# Patient Record
Sex: Female | Born: 1960 | Race: White | Hispanic: No | Marital: Married | Smoking: Never smoker
Health system: Southern US, Community
[De-identification: ages and names within clinical notes are randomized; demographics above are authoritative.]

## PROBLEM LIST (undated history)

## (undated) DIAGNOSIS — R112 Nausea with vomiting, unspecified: Secondary | ICD-10-CM

## (undated) DIAGNOSIS — Z460 Encounter for fitting and adjustment of spectacles and contact lenses: Secondary | ICD-10-CM

## (undated) DIAGNOSIS — E039 Hypothyroidism, unspecified: Secondary | ICD-10-CM

## (undated) DIAGNOSIS — I10 Essential (primary) hypertension: Secondary | ICD-10-CM

## (undated) DIAGNOSIS — Z9889 Other specified postprocedural states: Secondary | ICD-10-CM

## (undated) DIAGNOSIS — E079 Disorder of thyroid, unspecified: Secondary | ICD-10-CM

## (undated) HISTORY — PX: TONSILLECTOMY: SUR1361

## (undated) HISTORY — PX: ABDOMINAL HYSTERECTOMY: SHX81

## (undated) HISTORY — PX: TUBAL LIGATION: SHX77

## (undated) HISTORY — PX: WRIST ARTHROSCOPY: SUR100

## (undated) HISTORY — PX: CHOLECYSTECTOMY: SHX55

---

## 1999-10-25 ENCOUNTER — Encounter: Admission: RE | Admit: 1999-10-25 | Discharge: 2000-01-23 | Payer: Self-pay | Admitting: Internal Medicine

## 2000-11-20 ENCOUNTER — Encounter: Admission: RE | Admit: 2000-11-20 | Discharge: 2000-11-20 | Payer: Self-pay | Admitting: Internal Medicine

## 2000-11-20 ENCOUNTER — Encounter: Payer: Self-pay | Admitting: Internal Medicine

## 2001-02-13 ENCOUNTER — Encounter: Admission: RE | Admit: 2001-02-13 | Discharge: 2001-02-13 | Payer: Self-pay | Admitting: Internal Medicine

## 2001-02-13 ENCOUNTER — Encounter: Payer: Self-pay | Admitting: Internal Medicine

## 2001-02-15 ENCOUNTER — Encounter: Payer: Self-pay | Admitting: Internal Medicine

## 2001-02-15 ENCOUNTER — Ambulatory Visit (HOSPITAL_COMMUNITY): Admission: RE | Admit: 2001-02-15 | Discharge: 2001-02-15 | Payer: Self-pay | Admitting: Internal Medicine

## 2001-10-31 ENCOUNTER — Ambulatory Visit (HOSPITAL_BASED_OUTPATIENT_CLINIC_OR_DEPARTMENT_OTHER): Admission: RE | Admit: 2001-10-31 | Discharge: 2001-10-31 | Payer: Self-pay | Admitting: Orthopedic Surgery

## 2003-12-18 ENCOUNTER — Encounter: Admission: RE | Admit: 2003-12-18 | Discharge: 2003-12-18 | Payer: Self-pay | Admitting: Internal Medicine

## 2006-12-18 ENCOUNTER — Other Ambulatory Visit: Admission: RE | Admit: 2006-12-18 | Discharge: 2006-12-18 | Payer: Self-pay | Admitting: Family Medicine

## 2007-01-01 ENCOUNTER — Encounter: Admission: RE | Admit: 2007-01-01 | Discharge: 2007-01-01 | Payer: Self-pay | Admitting: Family Medicine

## 2008-02-11 ENCOUNTER — Encounter: Admission: RE | Admit: 2008-02-11 | Discharge: 2008-02-11 | Payer: Self-pay | Admitting: Family Medicine

## 2008-12-02 ENCOUNTER — Encounter: Admission: RE | Admit: 2008-12-02 | Discharge: 2008-12-02 | Payer: Self-pay | Admitting: Gastroenterology

## 2009-01-08 ENCOUNTER — Ambulatory Visit (HOSPITAL_COMMUNITY): Admission: RE | Admit: 2009-01-08 | Discharge: 2009-01-08 | Payer: Self-pay | Admitting: Sports Medicine

## 2009-01-23 ENCOUNTER — Ambulatory Visit: Payer: Self-pay | Admitting: Sports Medicine

## 2009-01-23 DIAGNOSIS — IMO0002 Reserved for concepts with insufficient information to code with codable children: Secondary | ICD-10-CM | POA: Insufficient documentation

## 2009-01-23 DIAGNOSIS — R071 Chest pain on breathing: Secondary | ICD-10-CM | POA: Insufficient documentation

## 2009-01-23 HISTORY — DX: Chest pain on breathing: R07.1

## 2009-01-26 ENCOUNTER — Encounter: Payer: Self-pay | Admitting: Sports Medicine

## 2009-03-02 ENCOUNTER — Encounter: Admission: RE | Admit: 2009-03-02 | Discharge: 2009-03-02 | Payer: Self-pay | Admitting: Family Medicine

## 2009-09-19 ENCOUNTER — Emergency Department (HOSPITAL_COMMUNITY): Admission: EM | Admit: 2009-09-19 | Discharge: 2009-09-19 | Payer: Self-pay | Admitting: Ophthalmology

## 2010-03-10 ENCOUNTER — Encounter: Admission: RE | Admit: 2010-03-10 | Discharge: 2010-03-10 | Payer: Self-pay | Admitting: Family Medicine

## 2010-04-26 ENCOUNTER — Encounter: Admission: RE | Admit: 2010-04-26 | Discharge: 2010-04-26 | Payer: Self-pay | Admitting: Nurse Practitioner

## 2011-01-09 ENCOUNTER — Encounter: Payer: Self-pay | Admitting: Family Medicine

## 2011-02-21 ENCOUNTER — Other Ambulatory Visit: Payer: Self-pay | Admitting: Nurse Practitioner

## 2011-02-21 DIAGNOSIS — Z1231 Encounter for screening mammogram for malignant neoplasm of breast: Secondary | ICD-10-CM

## 2011-03-14 ENCOUNTER — Ambulatory Visit: Payer: Self-pay

## 2011-03-23 ENCOUNTER — Ambulatory Visit
Admission: RE | Admit: 2011-03-23 | Discharge: 2011-03-23 | Disposition: A | Discharge status: Home / Self Care | Payer: BC Managed Care – PPO | Source: Ambulatory Visit | Attending: Nurse Practitioner | Admitting: Nurse Practitioner

## 2011-03-23 DIAGNOSIS — Z1231 Encounter for screening mammogram for malignant neoplasm of breast: Secondary | ICD-10-CM

## 2011-03-24 LAB — COMPREHENSIVE METABOLIC PANEL
Alkaline Phosphatase: 59 U/L (ref 39–117)
CO2: 24 mEq/L (ref 19–32)
Chloride: 107 mEq/L (ref 96–112)
Creatinine, Ser: 0.74 mg/dL (ref 0.4–1.2)
GFR calc non Af Amer: >60 mL/min (ref >60)
Glucose, Bld: 115 mg/dL — ABNORMAL HIGH (ref 70–99)
Sodium: 136 mEq/L (ref 135–145)
Total Bilirubin: 0.6 mg/dL (ref 0.3–1.2)
Total Protein: 6.4 g/dL (ref 6.0–8.3)

## 2011-03-24 LAB — DIFFERENTIAL
Monocytes Absolute: 0.5 10*3/uL (ref 0.1–1.0)
Monocytes Relative: 9 % (ref 3–12)
Neutrophils Relative %: 52 % (ref 43–77)

## 2011-03-24 LAB — URINALYSIS, ROUTINE W REFLEX MICROSCOPIC
Glucose, UA: NEGATIVE mg/dL
Hgb urine dipstick: NEGATIVE
Protein, ur: NEGATIVE mg/dL
Specific Gravity, Urine: 1.013 (ref 1.005–1.030)
Urobilinogen, UA: 0.2 mg/dL (ref 0.0–1.0)

## 2011-03-24 LAB — CBC
MCHC: 34.6 g/dL (ref 30.0–36.0)
MCV: 86.1 fL (ref 78.0–100.0)
RBC: 4.55 MIL/uL (ref 3.87–5.11)
WBC: 5.5 10*3/uL (ref 4.0–10.5)

## 2011-05-06 NOTE — H&P (Signed)
[place]. [facility]  Patient:    [name], [name] Visit Number: [id number] MRN: [id number]          Service Type: DSU Location: [facility] Attending Physician:  Marlowe Shores Dictated by:   Artist Pais Mina Marble, M.D. Admit Date:  10/31/2001                           History and Physical  PREOPERATIVE DIAGNOSIS:  Left wrist internal derangement.  POSTOPERATIVE DIAGNOSIS:  Left wrist internal derangement.  OPERATION:  Left wrist arthroscopy with debridement of ______ interosseous ligament tear and ulnar-sided synovectomy.  SURGEON:  Artist Pais. Mina Marble, M.D.  ASSISTANT:  R.N.  ANESTHESIA:  General.  TOURNIQUET TIME:  35 minutes.  COMPLICATIONS:  None.  DRAINS:  None.  DESCRIPTION OF PROCEDURE:  The patient was taken to the operating room.  After the induction of adequate general anesthesia, the left upper extremity was prepped and draped in the usual sterile fashion.  An Esmarch was used to exsanguinate the limb and the tourniquet inflated to 250 mmHg.  At this point in time the left upper extremity was padded and placed in the wrist traction tower with 12 pounds of ______ traction placed across the radiocarpal joint. A 25-gauge needle was used to establish a 3-port portal, and 5 cc of normal saline was instilled into the joint.  A small incision was made over this needle tract.  Blunt dissection was carried down to the capsule, and a blunt trocar was used to pierce the capsule and into the joint.  The scope was then inserted into the joint.  Visualization revealed intact radial side ligaments, intact scapholunate osseous ligament, a torn meniscal cruciate ligament, and an intact ______ .  An 18-gauge needle was used to establish a ______ outflow portal, followed by establishment under direct vision of a ______ working portal.  Once the ______ working portal was established, the suction jigger was placed into the ulnar side of the joint, and an  ulnar-sided synovectomy was performed.  Visualization at this point in time revealed a partially torn lunotriquetral ligament off the lunate.  This was debrided using a combination of a suction shaver, 2.9 gator, and a 1.5 mm Arthrocare wand.  After this was done, the ulnar-sided synovectomy was completed. Visualization revealed no inherent instability between the lunate and triquetrum, and the instruments were removed.  An 18-gauge needle was left in place, and Marcaine was injected into this for postoperative pain control. The portals were closed with 4-0 nylon, and the patient was then placed in a sterile dressing of xeroform, 4 x 4s, placed in a volar splint.  The patient tolerated the procedure well, went to the recovery room in stable fashion. Dictated by:   Artist Pais Mina Marble, M.D. Attending Physician:  Marlowe Shores DD:  10/31/01 TD:  10/31/01 Job: [id number]

## 2012-01-18 ENCOUNTER — Other Ambulatory Visit: Payer: Self-pay | Admitting: Orthopedic Surgery

## 2012-01-18 DIAGNOSIS — M25521 Pain in right elbow: Secondary | ICD-10-CM

## 2012-01-18 DIAGNOSIS — IMO0002 Reserved for concepts with insufficient information to code with codable children: Secondary | ICD-10-CM

## 2012-01-20 ENCOUNTER — Ambulatory Visit
Admission: RE | Admit: 2012-01-20 | Discharge: 2012-01-20 | Disposition: A | Discharge status: Home / Self Care | Payer: BC Managed Care – PPO | Source: Ambulatory Visit | Attending: Orthopedic Surgery | Admitting: Orthopedic Surgery

## 2012-01-20 DIAGNOSIS — M25521 Pain in right elbow: Secondary | ICD-10-CM

## 2012-01-20 DIAGNOSIS — IMO0002 Reserved for concepts with insufficient information to code with codable children: Secondary | ICD-10-CM

## 2012-01-23 ENCOUNTER — Other Ambulatory Visit: Payer: BC Managed Care – PPO

## 2012-03-07 ENCOUNTER — Other Ambulatory Visit: Payer: Self-pay | Admitting: Family Medicine

## 2012-03-07 DIAGNOSIS — Z780 Asymptomatic menopausal state: Secondary | ICD-10-CM

## 2012-03-20 ENCOUNTER — Other Ambulatory Visit: Payer: Self-pay | Admitting: Family Medicine

## 2012-03-20 DIAGNOSIS — Z1231 Encounter for screening mammogram for malignant neoplasm of breast: Secondary | ICD-10-CM

## 2012-04-16 ENCOUNTER — Ambulatory Visit: Payer: BC Managed Care – PPO

## 2012-04-16 ENCOUNTER — Other Ambulatory Visit: Payer: BC Managed Care – PPO

## 2012-04-30 ENCOUNTER — Ambulatory Visit
Admission: RE | Admit: 2012-04-30 | Discharge: 2012-04-30 | Disposition: A | Discharge status: Home / Self Care | Payer: BC Managed Care – PPO | Source: Ambulatory Visit | Attending: Family Medicine | Admitting: Family Medicine

## 2012-04-30 DIAGNOSIS — Z780 Asymptomatic menopausal state: Secondary | ICD-10-CM

## 2012-04-30 DIAGNOSIS — Z1231 Encounter for screening mammogram for malignant neoplasm of breast: Secondary | ICD-10-CM

## 2013-03-04 ENCOUNTER — Other Ambulatory Visit: Payer: Self-pay | Admitting: Orthopedic Surgery

## 2013-03-04 ENCOUNTER — Encounter (HOSPITAL_BASED_OUTPATIENT_CLINIC_OR_DEPARTMENT_OTHER): Payer: Self-pay | Admitting: *Deleted

## 2013-03-04 NOTE — Progress Notes (Signed)
No labs needed

## 2013-03-07 ENCOUNTER — Encounter (HOSPITAL_BASED_OUTPATIENT_CLINIC_OR_DEPARTMENT_OTHER): Payer: Self-pay | Admitting: Anesthesiology

## 2013-03-07 ENCOUNTER — Encounter (HOSPITAL_BASED_OUTPATIENT_CLINIC_OR_DEPARTMENT_OTHER)
Admission: RE | Disposition: A | Discharge status: Home / Self Care | Payer: Self-pay | Source: Ambulatory Visit | Attending: Orthopedic Surgery

## 2013-03-07 ENCOUNTER — Ambulatory Visit (HOSPITAL_BASED_OUTPATIENT_CLINIC_OR_DEPARTMENT_OTHER): Payer: BC Managed Care – PPO | Admitting: Anesthesiology

## 2013-03-07 ENCOUNTER — Ambulatory Visit (HOSPITAL_BASED_OUTPATIENT_CLINIC_OR_DEPARTMENT_OTHER)
Admission: RE | Admit: 2013-03-07 | Discharge: 2013-03-07 | Disposition: A | Discharge status: Home / Self Care | Payer: BC Managed Care – PPO | Source: Ambulatory Visit | Attending: Orthopedic Surgery | Admitting: Orthopedic Surgery

## 2013-03-07 ENCOUNTER — Encounter (HOSPITAL_BASED_OUTPATIENT_CLINIC_OR_DEPARTMENT_OTHER): Payer: Self-pay

## 2013-03-07 DIAGNOSIS — L03019 Cellulitis of unspecified finger: Secondary | ICD-10-CM | POA: Insufficient documentation

## 2013-03-07 DIAGNOSIS — Z91041 Radiographic dye allergy status: Secondary | ICD-10-CM | POA: Insufficient documentation

## 2013-03-07 DIAGNOSIS — Z881 Allergy status to other antibiotic agents status: Secondary | ICD-10-CM | POA: Insufficient documentation

## 2013-03-07 DIAGNOSIS — E039 Hypothyroidism, unspecified: Secondary | ICD-10-CM | POA: Insufficient documentation

## 2013-03-07 DIAGNOSIS — Z79899 Other long term (current) drug therapy: Secondary | ICD-10-CM | POA: Insufficient documentation

## 2013-03-07 DIAGNOSIS — Z885 Allergy status to narcotic agent status: Secondary | ICD-10-CM | POA: Insufficient documentation

## 2013-03-07 HISTORY — PX: I&D EXTREMITY: SHX5045

## 2013-03-07 HISTORY — DX: Hypothyroidism, unspecified: E03.9

## 2013-03-07 HISTORY — DX: Encounter for fitting and adjustment of spectacles and contact lenses: Z46.0

## 2013-03-07 HISTORY — DX: Other specified postprocedural states: Z98.890

## 2013-03-07 HISTORY — DX: Disorder of thyroid, unspecified: E07.9

## 2013-03-07 HISTORY — DX: Other specified postprocedural states: R11.2

## 2013-03-07 SURGERY — IRRIGATION AND DEBRIDEMENT EXTREMITY
Anesthesia: Regional | Site: Finger | Laterality: Right | Wound class: Dirty or Infected

## 2013-03-07 MED ORDER — CHLORHEXIDINE GLUCONATE 4 % EX LIQD: 60.0000 mL | Freq: Once | CUTANEOUS | Status: DC

## 2013-03-07 MED ORDER — IBUPROFEN 600 MG PO TABS
600.0000 mg | ORAL_TABLET | Freq: Once | ORAL | Status: AC | PRN
  Administered 2013-03-07: 600 mg via ORAL

## 2013-03-07 MED ORDER — CEFAZOLIN SODIUM-DEXTROSE 2-3 GM-% IV SOLR: 2.0000 g | INTRAVENOUS | Status: DC

## 2013-03-07 MED ORDER — LIDOCAINE HCL (PF) 0.5 % IJ SOLN
INTRAMUSCULAR | Status: DC | PRN
  Administered 2013-03-07: 30 mL via INTRAVENOUS

## 2013-03-07 MED ORDER — FENTANYL CITRATE 0.05 MG/ML IJ SOLN: 50.0000 ug | INTRAMUSCULAR | Status: DC | PRN

## 2013-03-07 MED ORDER — DIPHENHYDRAMINE HCL 50 MG/ML IJ SOLN
12.5000 mg | Freq: Once | INTRAMUSCULAR | Status: AC
  Administered 2013-03-07: 12.5 mg via INTRAVENOUS

## 2013-03-07 MED ORDER — FENTANYL CITRATE 0.05 MG/ML IJ SOLN: 25.0000 ug | INTRAMUSCULAR | Status: DC | PRN

## 2013-03-07 MED ORDER — BUTALBITAL-APAP-CAFFEINE 50-325-40 MG PO TABS: 1.0000 | ORAL_TABLET | Freq: Four times a day (QID) | ORAL | Status: AC | PRN

## 2013-03-07 MED ORDER — MIDAZOLAM HCL 5 MG/5ML IJ SOLN
INTRAMUSCULAR | Status: DC | PRN
  Administered 2013-03-07: 2 mg via INTRAVENOUS

## 2013-03-07 MED ORDER — MIDAZOLAM HCL 2 MG/2ML IJ SOLN: 1.0000 mg | INTRAMUSCULAR | Status: DC | PRN

## 2013-03-07 MED ORDER — PROPOFOL 10 MG/ML IV EMUL
INTRAVENOUS | Status: DC | PRN
  Administered 2013-03-07: 50 ug/kg/min via INTRAVENOUS

## 2013-03-07 MED ORDER — VANCOMYCIN HCL 1000 MG IV SOLR
1000.0000 mg | INTRAVENOUS | Status: DC | PRN
  Administered 2013-03-07: 1000 mg via INTRAVENOUS

## 2013-03-07 MED ORDER — LACTATED RINGERS IV SOLN
INTRAVENOUS | Status: DC
  Administered 2013-03-07 (×2): via INTRAVENOUS

## 2013-03-07 MED ORDER — BUPIVACAINE HCL (PF) 0.25 % IJ SOLN
INTRAMUSCULAR | Status: DC | PRN
  Administered 2013-03-07: 10 mL

## 2013-03-07 MED ORDER — ONDANSETRON HCL 4 MG/2ML IJ SOLN
INTRAMUSCULAR | Status: DC | PRN
  Administered 2013-03-07: 4 mg via INTRAVENOUS

## 2013-03-07 MED ORDER — DOXYCYCLINE HYCLATE 50 MG PO CAPS: 100.0000 mg | ORAL_CAPSULE | Freq: Two times a day (BID) | ORAL | Status: DC

## 2013-03-07 MED ORDER — FENTANYL CITRATE 0.05 MG/ML IJ SOLN
INTRAMUSCULAR | Status: DC | PRN
  Administered 2013-03-07: 50 ug via INTRAVENOUS

## 2013-03-07 MED ORDER — ONDANSETRON HCL 4 MG/2ML IJ SOLN: 4.0000 mg | Freq: Four times a day (QID) | INTRAMUSCULAR | Status: DC | PRN

## 2013-03-07 SURGICAL SUPPLY — 50 items
BAG DECANTER FOR FLEXI CONT (MISCELLANEOUS) IMPLANT
BANDAGE ELASTIC 3 VELCRO ST LF (GAUZE/BANDAGES/DRESSINGS) IMPLANT
BANDAGE GAUZE ELAST BULKY 4 IN (GAUZE/BANDAGES/DRESSINGS) IMPLANT
BANDAGE GAUZE STRT 1 STR LF (GAUZE/BANDAGES/DRESSINGS) ×2 IMPLANT
BLADE MINI RND TIP GREEN BEAV (BLADE) IMPLANT
BLADE SURG 15 STRL LF DISP TIS (BLADE) ×2 IMPLANT
BLADE SURG 15 STRL SS (BLADE) ×2
BNDG COHESIVE 1X5 TAN STRL LF (GAUZE/BANDAGES/DRESSINGS) ×2 IMPLANT
BNDG ELASTIC 2 VLCR STRL LF (GAUZE/BANDAGES/DRESSINGS) IMPLANT
BNDG ESMARK 4X9 LF (GAUZE/BANDAGES/DRESSINGS) IMPLANT
CHLORAPREP W/TINT 26ML (MISCELLANEOUS) ×2 IMPLANT
CLOTH BEACON ORANGE TIMEOUT ST (SAFETY) ×2 IMPLANT
CORDS BIPOLAR (ELECTRODE) ×2 IMPLANT
COVER MAYO STAND STRL (DRAPES) ×2 IMPLANT
COVER TABLE BACK 60X90 (DRAPES) ×2 IMPLANT
CUFF TOURNIQUET SINGLE 18IN (TOURNIQUET CUFF) ×2 IMPLANT
DRAPE EXTREMITY T 121X128X90 (DRAPE) ×2 IMPLANT
DRAPE SURG 17X23 STRL (DRAPES) ×2 IMPLANT
GAUZE PACKING IODOFORM 1/4X5 (PACKING) ×2 IMPLANT
GAUZE XEROFORM 1X8 LF (GAUZE/BANDAGES/DRESSINGS) ×2 IMPLANT
GLOVE BIO SURGEON STRL SZ7.5 (GLOVE) ×2 IMPLANT
GLOVE BIOGEL PI IND STRL 6.5 (GLOVE) ×1 IMPLANT
GLOVE BIOGEL PI IND STRL 8 (GLOVE) ×1 IMPLANT
GLOVE BIOGEL PI INDICATOR 6.5 (GLOVE) ×1
GLOVE BIOGEL PI INDICATOR 8 (GLOVE) ×1
GLOVE ECLIPSE 6.5 STRL STRAW (GLOVE) ×2 IMPLANT
GOWN BRE IMP PREV XXLGXLNG (GOWN DISPOSABLE) ×2 IMPLANT
GOWN PREVENTION PLUS XLARGE (GOWN DISPOSABLE) ×2 IMPLANT
LOOP VESSEL MAXI BLUE (MISCELLANEOUS) IMPLANT
NEEDLE HYPO 25X1 1.5 SAFETY (NEEDLE) ×2 IMPLANT
NS IRRIG 1000ML POUR BTL (IV SOLUTION) ×2 IMPLANT
PACK BASIN DAY SURGERY FS (CUSTOM PROCEDURE TRAY) ×2 IMPLANT
PAD CAST 3X4 CTTN HI CHSV (CAST SUPPLIES) IMPLANT
PADDING CAST ABS 4INX4YD NS (CAST SUPPLIES) ×1
PADDING CAST ABS COTTON 4X4 ST (CAST SUPPLIES) ×1 IMPLANT
PADDING CAST COTTON 3X4 STRL (CAST SUPPLIES)
SPLINT FNGR PLAIN END 5/8X3.25 (CAST SUPPLIES) ×1 IMPLANT
SPLINT PLASTALUME 3 1/4 (CAST SUPPLIES) ×2
SPLINT PLASTER CAST XFAST 3X15 (CAST SUPPLIES) IMPLANT
SPLINT PLASTER XTRA FASTSET 3X (CAST SUPPLIES)
SPONGE GAUZE 4X4 12PLY (GAUZE/BANDAGES/DRESSINGS) ×2 IMPLANT
STOCKINETTE 4X48 STRL (DRAPES) ×2 IMPLANT
SUT ETHILON 4 0 PS 2 18 (SUTURE) IMPLANT
SWAB COLLECTION DEVICE MRSA (MISCELLANEOUS) ×2 IMPLANT
SYR BULB 3OZ (MISCELLANEOUS) ×2 IMPLANT
SYR CONTROL 10ML LL (SYRINGE) ×2 IMPLANT
TOWEL OR 17X24 6PK STRL BLUE (TOWEL DISPOSABLE) ×2 IMPLANT
TUBE ANAEROBIC SPECIMEN COL (MISCELLANEOUS) ×2 IMPLANT
TUBE FEEDING 5FR 15 INCH (TUBING) IMPLANT
UNDERPAD 30X30 INCONTINENT (UNDERPADS AND DIAPERS) ×2 IMPLANT

## 2013-03-07 NOTE — Transfer of Care (Signed)
Immediate Anesthesia Transfer of Care Note  Patient: [name]  Procedure(s) Performed: Procedure(s): IRRIGATION AND DEBRIDEMENT RIGHT LONG FINGER (Right)  Patient Location: PACU  Anesthesia Type:Bier block  Level of Consciousness: sedated  Airway & Oxygen Therapy: Patient Spontanous Breathing and Patient connected to face mask oxygen  Post-op Assessment: Report given to PACU RN and Post -op Vital signs reviewed and stable  Post vital signs: Reviewed and stable  Complications: No apparent anesthesia complications

## 2013-03-07 NOTE — Op Note (Signed)
[id number] 

## 2013-03-07 NOTE — Anesthesia Preprocedure Evaluation (Signed)
Anesthesia Evaluation  Patient identified by MRN, date of birth, ID band Patient awake    Reviewed: Allergy & Precautions, H&P , NPO status , Patient's Chart, lab work & pertinent test results  History of Anesthesia Complications (+) PONV  Airway Mallampati: II  Neck ROM: full    Dental   Pulmonary          Cardiovascular     Neuro/Psych    GI/Hepatic   Endo/Other  Hypothyroidism   Renal/GU      Musculoskeletal   Abdominal   Peds  Hematology   Anesthesia Other Findings   Reproductive/Obstetrics                           Anesthesia Physical Anesthesia Plan  ASA: II  Anesthesia Plan: General   Post-op Pain Management:    Induction: Intravenous  Airway Management Planned: LMA  Additional Equipment:   Intra-op Plan:   Post-operative Plan:   Informed Consent: I have reviewed the patients History and Physical, chart, labs and discussed the procedure including the risks, benefits and alternatives for the proposed anesthesia with the patient or authorized representative who has indicated his/her understanding and acceptance.     Plan Discussed with: CRNA and Surgeon  Anesthesia Plan Comments:         Anesthesia Quick Evaluation

## 2013-03-07 NOTE — Anesthesia Postprocedure Evaluation (Signed)
Anesthesia Post Note  Patient: [name]  Procedure(s) Performed: Procedure(s) (LRB): IRRIGATION AND DEBRIDEMENT RIGHT LONG FINGER (Right)  Anesthesia type: MAC  Patient location: PACU  Post pain: Pain level controlled and Adequate analgesia  Post assessment: Post-op Vital signs reviewed, Patient's Cardiovascular Status Stable and Respiratory Function Stable  Last Vitals:  Filed Vitals:   03/07/13 1345  BP: 137/88  Pulse: 65  Temp:   Resp: 13    Post vital signs: Reviewed and stable  Level of consciousness: awake, alert  and oriented  Complications: No apparent anesthesia complications

## 2013-03-07 NOTE — Brief Op Note (Signed)
03/07/2013  12:25 PM  PATIENT:  Otilio Miu  52 y.o. female  PRE-OPERATIVE DIAGNOSIS:  RIGHT LONG PISONYCHIA  POST-OPERATIVE DIAGNOSIS:  RIGHT LONG PISONYCHIA  PROCEDURE:  Procedure(s): IRRIGATION AND DEBRIDEMENT RIGHT LONG FINGER (Right)  SURGEON:  Surgeon(s) and Role:    * Tami Ribas, MD - Primary  PHYSICIAN ASSISTANT:   ASSISTANTS: none   ANESTHESIA:   Bier block  EBL:  Total I/O In: 1000 [I.V.:1000] Out: -   BLOOD ADMINISTERED:none  DRAINS: iodoform packing  LOCAL MEDICATIONS USED:  MARCAINE     SPECIMEN:  Source of Specimen:  right long finger  DISPOSITION OF SPECIMEN:  micro  COUNTS:  YES  TOURNIQUET:   Total Tourniquet Time Documented: Forearm (Right) - 30 minutes Total: Forearm (Right) - 30 minutes   DICTATION: .Other Dictation: Dictation Number [id number]  PLAN OF CARE: Discharge to home after PACU  PATIENT DISPOSITION:  PACU - hemodynamically stable.

## 2013-03-07 NOTE — H&P (Signed)
  [name] is an 52 y.o. female.   Chief Complaint: right long finger paronychia HPI: 52 yo rhd female 5 weeks s/p I&D of right long finger paronychia.  Continues to have swelling/erythema/pain at site every time she stops antibiotics.  No fevers, chills, night sweats.  Plan repeat I&D of right long finger.  Past Medical History  Diagnosis Date  . Hypothyroidism   . Contact lens/glasses fitting     wears contacts or glasses  . Thyroid condition   . PONV (postoperative nausea and vomiting)     Past Surgical History  Procedure Laterality Date  . Wrist arthroscopy  2011x2    tendon repair -left  . Wrist arthroscopy      rt  . Cholecystectomy    . Cesarean section      x2  . Abdominal hysterectomy    . Tubal ligation    . Tonsillectomy      History reviewed. No pertinent family history. Social History:  reports that she has never smoked. She does not have any smokeless tobacco history on file. She reports that  drinks alcohol. She reports that she does not use illicit drugs.  Allergies:  Allergies  Allergen Reactions  . Ancef (Cefazolin) Swelling  . Hydrocodone Nausea And Vomiting  . Iohexol      Desc: PT HAD RECATION TO IV CONTRAST MEDIA IN THE 1990'S PT HAD FACIAL SWELLING, Onset Date: [id number]   . Percocet (Oxycodone-Acetaminophen) Nausea And Vomiting    Medications Prior to Admission  Medication Sig Dispense Refill  . liothyronine (CYTOMEL) 5 MCG tablet Take 5 mcg by mouth daily.      . naltrexone (DEPADE) 50 MG tablet Take 50 mg by mouth daily. Takes 4.5      . progesterone (PROMETRIUM) 100 MG capsule Take 100 mg by mouth daily.      Marland Kitchen testosterone (ANDROGEL) 50 MG/5GM GEL Place 5 g onto the skin every other day.      . thyroid (ARMOUR) 60 MG tablet Take 60 mg by mouth 2 (two) times daily.        No results found for this or any previous visit (from the past 48 hour(s)).  No results found.   A comprehensive review of systems was negative except for:  Eyes: positive for contacts/glasses  Blood pressure 124/78, pulse 77, temperature 98.4 F (36.9 C), temperature source Oral, resp. rate 18, height 5\' 6"  (1.676 m), weight 56.473 kg (124 lb 8 oz), SpO2 99.00%.  General appearance: alert, cooperative and appears stated age Head: Normocephalic, without obvious abnormality, atraumatic Neck: supple, symmetrical, trachea midline Resp: clear to auscultation bilaterally Cardio: regular rate and rhythm GI: non tender Extremities: intact sensation and capillary refill all digits.  right long with swelling and tenderness at radial side of nail.  no proximal streaks.  no tenderness in pad. Pulses: 2+ and symmetric Skin: swelling and redness at radial side of right long finger nail Neurologic: Grossly normal Incision/Wound: na  Assessment/Plan Right long finger recurrent paronychia.  Plan repeat I&D of right long finger.  Risks, benefits, and alternatives of surgery were discussed and the patient agrees with the plan of care.   Lani Mendiola R 03/07/2013, 11:10 AM

## 2013-03-08 ENCOUNTER — Encounter (HOSPITAL_BASED_OUTPATIENT_CLINIC_OR_DEPARTMENT_OTHER): Payer: Self-pay | Admitting: Orthopedic Surgery

## 2013-03-08 NOTE — Op Note (Signed)
NAMETresea, [name] NO.:  [id number]  MEDICAL RECORD NO.:  [id number]  LOCATION:                                 FACILITY:  PHYSICIAN:  Betha Loa, MD             DATE OF BIRTH:  DATE OF PROCEDURE:  03/07/2013 DATE OF DISCHARGE:                              OPERATIVE REPORT   PREOPERATIVE DIAGNOSIS:  Right long finger recurrent paronychia.  POSTOPERATIVE DIAGNOSIS:  Right long finger recurrent paronychia.  PROCEDURE:  Irrigation and debridement of right long finger paronychia.  SURGEON:  Betha Loa, MD  ASSISTANT:  None.  ANESTHESIA:  Bier block with sedation.  IV FLUIDS:  Per Anesthesia flow sheet.  ESTIMATED BLOOD LOSS:  Minimal.  COMPLICATIONS:  None.  SPECIMENS:  Cultures for aerobes, anaerobes, AFB, and fungus from right long finger to Microbiology.  TOURNIQUET TIME:  30 minutes.  DISPOSITION:  Stable to PACU.  INDICATIONS:  [name] is a 52 year old female who has had a paronychia of the right long finger for approximately 5 weeks.  This was irrigated and debrided in the office initially.  She continues to have swelling, erythema, and pain every time she goes off antibiotics.  She returned to the operating room today for repeat irrigation and debridement of the right long finger paronychia.  Risks, benefits, and alternatives of surgery were discussed including risk of blood loss, infection, damage to nerves, vessels, tendons, ligaments, bone, failure of surgery, need for additional surgery, complications with wound healing, continued pain, continued infection, need for repeat irrigation and debridement.  She voiced understanding of these risks and elected to proceed.  OPERATIVE COURSE:  After being identified preoperatively by myself, the patient and I agreed upon procedure and site of procedure.  Surgical site was marked.  The risks, benefits, and alternatives of surgery were reviewed, and she wished to proceed.  Surgical  consent had been signed. Antibiotics were held for cultures.  She was transferred to the operating room, placed on the operating room table in supine position with the right upper extremity on arm board.  Bier block anesthesia was induced by anesthesiologist.  The right upper extremity was prepped and draped in normal sterile orthopedic fashion.  Surgical pause was performed between surgeons, anesthesia, and operating staff, and all were in agreement as to the patient, procedure, and site of procedure. The tourniquet had been inflated for the Bier block.  The nail was removed from the long finger.  There was no gross purulence.  The radial side of nail fold where the swelling, erythema was, there was a piece of unusual looking tissue on the undersurface of the nail fold.  This was removed.  Incision was made.  There was no gross purulence.  I did see any foreign matter.  The soft tissues were spread.  Cultures for aerobes, anaerobes, AFB, and fungus were taken.  The wound was copiously irrigated with sterile saline.  The wound was packed with quarter-inch iodoform gauze.  Piece of Xeroform was placed in the nail fold.  The digital block was performed with 10 mL of 0.25% plain Marcaine to aid in  postoperative analgesia.  Wound was then dressed with sterile Xeroform, 4x4s, and wrapped with a Coban dressing lightly.  Alumafoam splint was placed for comfort and wrapped with the Coban as well.  The tourniquet was deflated at 30 minutes.  Preoperative drapes were broken down.  The patient was awoken from anesthesia safely.  She was transferred back to stretcher and taken to the PACU in stable condition.  I will see her back in the office in 4 days for postprocedure followup.  I will give her Fioricet for pain as she has significant nausea with hydrocodone and Ultram.  She has had her Fioricet in the past with no problems.     Betha Loa, MD     KK/MEDQ  D:  03/07/2013  T:  03/08/2013   Job:  [id number]

## 2013-03-09 LAB — WOUND CULTURE

## 2013-03-12 LAB — ANAEROBIC CULTURE: Gram Stain: NONE SEEN

## 2013-04-05 LAB — FUNGUS CULTURE W SMEAR

## 2013-04-19 LAB — AFB CULTURE WITH SMEAR (NOT AT ARMC): Acid Fast Smear: NONE SEEN

## 2013-07-11 ENCOUNTER — Other Ambulatory Visit: Payer: Self-pay

## 2013-07-11 DIAGNOSIS — Z1231 Encounter for screening mammogram for malignant neoplasm of breast: Secondary | ICD-10-CM

## 2013-07-29 ENCOUNTER — Ambulatory Visit
Admission: RE | Admit: 2013-07-29 | Discharge: 2013-07-29 | Disposition: A | Discharge status: Home / Self Care | Payer: BC Managed Care – PPO | Source: Ambulatory Visit

## 2013-07-29 DIAGNOSIS — Z1231 Encounter for screening mammogram for malignant neoplasm of breast: Secondary | ICD-10-CM

## 2014-05-31 ENCOUNTER — Encounter (HOSPITAL_COMMUNITY): Payer: Self-pay | Admitting: Emergency Medicine

## 2014-05-31 ENCOUNTER — Emergency Department (HOSPITAL_COMMUNITY)
Admission: EM | Admit: 2014-05-31 | Discharge: 2014-05-31 | Disposition: A | Discharge status: Home / Self Care | Payer: BC Managed Care – PPO | Attending: Emergency Medicine | Admitting: Emergency Medicine

## 2014-05-31 DIAGNOSIS — M6281 Muscle weakness (generalized): Secondary | ICD-10-CM | POA: Insufficient documentation

## 2014-05-31 DIAGNOSIS — IMO0002 Reserved for concepts with insufficient information to code with codable children: Secondary | ICD-10-CM | POA: Insufficient documentation

## 2014-05-31 DIAGNOSIS — E039 Hypothyroidism, unspecified: Secondary | ICD-10-CM | POA: Insufficient documentation

## 2014-05-31 DIAGNOSIS — R7989 Other specified abnormal findings of blood chemistry: Secondary | ICD-10-CM

## 2014-05-31 DIAGNOSIS — Z79899 Other long term (current) drug therapy: Secondary | ICD-10-CM | POA: Insufficient documentation

## 2014-05-31 DIAGNOSIS — R946 Abnormal results of thyroid function studies: Secondary | ICD-10-CM | POA: Insufficient documentation

## 2014-05-31 DIAGNOSIS — R0602 Shortness of breath: Secondary | ICD-10-CM | POA: Insufficient documentation

## 2014-05-31 DIAGNOSIS — E079 Disorder of thyroid, unspecified: Secondary | ICD-10-CM | POA: Insufficient documentation

## 2014-05-31 DIAGNOSIS — R42 Dizziness and giddiness: Secondary | ICD-10-CM | POA: Insufficient documentation

## 2014-05-31 DIAGNOSIS — R531 Weakness: Secondary | ICD-10-CM

## 2014-05-31 LAB — COMPREHENSIVE METABOLIC PANEL
ALBUMIN: 3.6 g/dL (ref 3.5–5.2)
ALK PHOS: 52 U/L (ref 39–117)
ALT: 11 U/L (ref 0–35)
AST: 14 U/L (ref 0–37)
BILIRUBIN TOTAL: 0.4 mg/dL (ref 0.3–1.2)
BUN: 16 mg/dL (ref 6–23)
CHLORIDE: 109 meq/L (ref 96–112)
CO2: 23 mEq/L (ref 19–32)
Calcium: 8.7 mg/dL (ref 8.4–10.5)
Creatinine, Ser: 0.66 mg/dL (ref 0.50–1.10)
GFR calc Af Amer: >90 mL/min (ref >90)
GFR calc non Af Amer: >90 mL/min (ref >90)
Glucose, Bld: 105 mg/dL — ABNORMAL HIGH (ref 70–99)
POTASSIUM: 4 meq/L (ref 3.7–5.3)
SODIUM: 144 meq/L (ref 137–147)
TOTAL PROTEIN: 6.7 g/dL (ref 6.0–8.3)

## 2014-05-31 LAB — CBC WITH DIFFERENTIAL/PLATELET
BASOS PCT: 0 % (ref 0–1)
Basophils Absolute: 0 10*3/uL (ref 0.0–0.1)
EOS ABS: 0.1 10*3/uL (ref 0.0–0.7)
Eosinophils Relative: 2 % (ref 0–5)
HCT: 42.5 % (ref 36.0–46.0)
HEMOGLOBIN: 14 g/dL (ref 12.0–15.0)
LYMPHS ABS: 1.3 10*3/uL (ref 0.7–4.0)
Lymphocytes Relative: 23 % (ref 12–46)
MCH: 28.5 pg (ref 26.0–34.0)
MCHC: 32.9 g/dL (ref 30.0–36.0)
MCV: 86.6 fL (ref 78.0–100.0)
Monocytes Absolute: 0.3 10*3/uL (ref 0.1–1.0)
Monocytes Relative: 6 % (ref 3–12)
NEUTROS ABS: 3.9 10*3/uL (ref 1.7–7.7)
NEUTROS PCT: 69 % (ref 43–77)
PLATELETS: 174 10*3/uL (ref 150–400)
RBC: 4.91 MIL/uL (ref 3.87–5.11)
RDW: 12.5 % (ref 11.5–15.5)
WBC: 5.6 10*3/uL (ref 4.0–10.5)

## 2014-05-31 LAB — URINALYSIS, ROUTINE W REFLEX MICROSCOPIC
BILIRUBIN URINE: NEGATIVE
GLUCOSE, UA: NEGATIVE mg/dL
HGB URINE DIPSTICK: NEGATIVE
Ketones, ur: NEGATIVE mg/dL
Leukocytes, UA: NEGATIVE
Nitrite: NEGATIVE
PH: 7 (ref 5.0–8.0)
Protein, ur: NEGATIVE mg/dL
SPECIFIC GRAVITY, URINE: 1.011 (ref 1.005–1.030)
Urobilinogen, UA: 0.2 mg/dL (ref 0.0–1.0)

## 2014-05-31 LAB — I-STAT TROPONIN, ED: Troponin i, poc: 0.01 ng/mL (ref 0.00–0.08)

## 2014-05-31 LAB — TSH: TSH: 0.007 u[IU]/mL — ABNORMAL LOW (ref 0.350–4.500)

## 2014-05-31 MED ORDER — DIAZEPAM 5 MG PO TABS
5.0000 mg | ORAL_TABLET | Freq: Once | ORAL | Status: AC
  Administered 2014-05-31: 5 mg via ORAL
  Filled 2014-05-31: qty 1

## 2014-05-31 MED ORDER — DIAZEPAM 5 MG PO TABS: 5.0000 mg | ORAL_TABLET | Freq: Two times a day (BID) | ORAL | Status: DC

## 2014-05-31 MED ORDER — SODIUM CHLORIDE 0.9 % IV BOLUS (SEPSIS)
1000.0000 mL | Freq: Once | INTRAVENOUS | Status: AC
  Administered 2014-05-31: 1000 mL via INTRAVENOUS

## 2014-05-31 NOTE — ED Provider Notes (Signed)
Medical screening examination/treatment/procedure(s) were performed by non-physician practitioner and as supervising physician I was immediately available for consultation/collaboration.   EKG Interpretation   Date/Time:  Saturday May 31 2014 09:04:35 EDT Ventricular Rate:  96 PR Interval:  122 QRS Duration: 82 QT Interval:  368 QTC Calculation: 465 R Axis:   53 Text Interpretation:  Sinus rhythm RSR' in V1 or V2, probably normal  variant ST elev, probable normal early repol pattern Confirmed by  Rubin PayorPICKERING  MD, Excell Neyland [phone]) on 05/31/2014 9:26:31 AM       Juliet RudeNathan R. Rubin PayorPickering, MD 05/31/14 [phone]

## 2014-05-31 NOTE — ED Provider Notes (Signed)
CSN: [id number]     Arrival date & time 05/31/14  0857 History   First MD Initiated Contact with Patient 05/31/14 [phone]     Chief Complaint  Patient presents with  . Dizziness  . Weakness     (Consider location/radiation/quality/duration/timing/severity/associated sxs/prior Treatment) HPI Comments: Patient with history of hyperthyroidism presents with complaint of weakness over her entire body as well as dizziness described as spinning sensation. Patient states that she awoke from sleep early this morning and felt dizzy. She ate to see if this would improve symptoms. She does not weak at this time. Upon awaking this morning, patient had worsening spinning sensation as well as weakness everywhere to the point were she cannot walk. She feels a "heavy weight on her entire body". She feels short of breath with any movement. She denies chest pain. No fever or URI symptoms. No neck pain. No numbness or tingling. No abdominal pain. No nausea, vomiting, diarrhea. No dark stools or bloody stools. No urinary symptoms of dysuria, hematuria. Patient is usually fairly active, rides horses, cleans houses. The onset of this condition was acute. The course is constant. Aggravating factors: none. Alleviating factors: none.    Patient is a 53 y.o. female presenting with dizziness and weakness. The history is provided by the patient and medical records.  Dizziness Associated symptoms: shortness of breath   Associated symptoms: no chest pain, no diarrhea, no headaches, no nausea and no vomiting   Weakness Associated symptoms include weakness. Pertinent negatives include no abdominal pain, chest pain, congestion, coughing, fever, headaches, myalgias, nausea, numbness, rash, sore throat or vomiting.    Past Medical History  Diagnosis Date  . Hypothyroidism   . Contact lens/glasses fitting     wears contacts or glasses  . Thyroid condition   . PONV (postoperative nausea and vomiting)    Past Surgical History   Procedure Laterality Date  . Wrist arthroscopy  2011x2    tendon repair -left  . Wrist arthroscopy      rt  . Cholecystectomy    . Cesarean section      x2  . Abdominal hysterectomy    . Tubal ligation    . Tonsillectomy    . I&d extremity Right 03/07/2013    Procedure: IRRIGATION AND DEBRIDEMENT RIGHT LONG FINGER;  Surgeon: Tami Ribas, MD;  Location: [facility];  Service: Orthopedics;  Laterality: Right;   History reviewed. No pertinent family history. History  Substance Use Topics  . Smoking status: Never Smoker   . Smokeless tobacco: Never Used  . Alcohol Use: Yes     Comment: "a glass of wine every now and then"   OB History   Grav Para Term Preterm Abortions TAB SAB Ect Mult Living                 Review of Systems  Constitutional: Negative for fever.  HENT: Negative for congestion, rhinorrhea and sore throat.   Eyes: Negative for redness.  Respiratory: Positive for shortness of breath. Negative for cough and wheezing.   Cardiovascular: Negative for chest pain.  Gastrointestinal: Negative for nausea, vomiting, abdominal pain and diarrhea.  Genitourinary: Negative for dysuria.  Musculoskeletal: Negative for myalgias.  Skin: Negative for rash.  Neurological: Positive for dizziness and weakness. Negative for syncope, facial asymmetry, speech difficulty, light-headedness, numbness and headaches.   Allergies  Ancef; Hydrocodone; Iohexol; and Percocet  Home Medications   Prior to Admission medications   Medication Sig Start Date End Date Taking? Authorizing  Provider  ARMOUR THYROID 60 MG tablet Take 120 mg by mouth daily. 05/05/14  Yes Historical Provider, MD  Cholecalciferol (VITAMIN D PO) Take 1 tablet by mouth 2 (two) times a week.   Yes Historical Provider, MD  Coenzyme Q10 (CO Q 10 PO) Take 1 capsule by mouth 2 (two) times a week.   Yes Historical Provider, MD  Cyanocobalamin (B-12 PO) Take 1 tablet by mouth 2 (two) times a week.   Yes Historical  Provider, MD  Estradiol-Estriol-Progesterone (BIEST/PROGESTERONE) CREA Place 1 application onto the skin daily.   Yes Historical Provider, MD  liothyronine (CYTOMEL) 25 MCG tablet Take 25 mcg by mouth daily.   Yes Historical Provider, MD  Misc Natural Products (NF FORMULAS TESTOSTERONE PO) Apply 1 application topically every other day.   Yes Historical Provider, MD  naltrexone (DEPADE) 50 MG tablet Take 50 mg by mouth at bedtime.   Yes Historical Provider, MD  Nutritional Supplements (DHEA PO) Take 5 mg by mouth 2 (two) times a week.   Yes Historical Provider, MD  Progesterone Micronized (PROGESTERONE PO) Take 150 mg by mouth daily.   Yes Historical Provider, MD  Red Yeast Rice Extract (RED YEAST RICE PO) Take 1 capsule by mouth daily.   Yes Historical Provider, MD  SYNTHROID 50 MCG tablet Take 50 mcg by mouth daily. 04/28/14  Yes Historical Provider, MD  diazepam (VALIUM) 5 MG tablet Take 1 tablet (5 mg total) by mouth 2 (two) times daily. 05/31/14   Renne CriglerJoshua Rajanee Schuelke, PA-C   BP 163/77  Pulse 69  Temp(Src) 97.7 F (36.5 C) (Oral)  Resp 17  SpO2 98%  Physical Exam  Nursing note and vitals reviewed. Constitutional: She is oriented to person, place, and time. She appears well-developed and well-nourished.  HENT:  Head: Normocephalic and atraumatic.  Right Ear: Tympanic membrane, external ear and ear canal normal.  Left Ear: Tympanic membrane, external ear and ear canal normal.  Nose: Nose normal.  Mouth/Throat: Uvula is midline, oropharynx is clear and moist and mucous membranes are normal.  Eyes: Conjunctivae, EOM and lids are normal. Pupils are equal, round, and reactive to light. Right eye exhibits no nystagmus. Left eye exhibits no nystagmus.  Neck: Normal range of motion. Neck supple.  Cardiovascular: Normal rate and regular rhythm.   Pulmonary/Chest: Effort normal and breath sounds normal.  Abdominal: Soft. There is no tenderness.  Musculoskeletal:       Cervical back: She exhibits  normal range of motion, no tenderness and no bony tenderness.  Patient with inconsistent exam. She can barely lift her arms and legs against gravity and gives inconsistent effort with flexor/extensor testing of extremities.   Neurological: She is alert and oriented to person, place, and time. She has normal strength and normal reflexes. No cranial nerve deficit or sensory deficit. She displays a negative Romberg sign. Coordination and gait normal. GCS eye subscore is 4. GCS verbal subscore is 5. GCS motor subscore is 6.  Skin: Skin is warm and dry.  Psychiatric: She has a normal mood and affect.    ED Course  Procedures (including critical care time) Labs Review Labs Reviewed  COMPREHENSIVE METABOLIC PANEL - Abnormal; Notable for the following:    Glucose, Bld 105 (*)    All other components within normal limits  TSH - Abnormal; Notable for the following:    TSH 0.007 (*)    All other components within normal limits  CBC WITH DIFFERENTIAL  URINALYSIS, ROUTINE W REFLEX MICROSCOPIC  I-STAT TROPOININ, ED  Imaging Review No results found.   EKG Interpretation   Date/Time:  Saturday May 31 2014 09:04:35 EDT Ventricular Rate:  96 PR Interval:  122 QRS Duration: 82 QT Interval:  368 QTC Calculation: 465 R Axis:   53 Text Interpretation:  Sinus rhythm RSR' in V1 or V2, probably normal  variant ST elev, probable normal early repol pattern Confirmed by  Rubin PayorPICKERING  MD, NATHAN [phone]([id number]) on 05/31/2014 9:26:31 AM      1:05 PM Patient informed of labs. She is feeling better. She has walked to the bathroom with minimal assistance. Vertigo is resolved with valium.   Patient will f/u with PCP to address low TSH and adjust thyroid hormone.   Patient encouraged to eat and hydrate well for the next several days. She is to return with worsening symptoms. Patient counseled to return if they have weakness in their arms or legs, slurred speech, trouble walking or talking, confusion, trouble with  their balance, or if they have any other concerns. Patient verbalizes understanding and agrees with plan.   MDM   Final diagnoses:  Vertigo  Generalized weakness  Low TSH level   Patient with generalized weakness and dizziness most consistent with vertigo. Symptoms improved in emergency department with fluids and oral Valium. Patient is feeling better. She has ambulated without difficulty. No indications for admission. Return instructions discussed with patient and family at bedside. PCP followup as above.    Renne CriglerJoshua Kruze Atchley, PA-C 05/31/14 1310

## 2014-05-31 NOTE — ED Notes (Signed)
Pt ambulated to bathroom with 1 staff assist

## 2014-05-31 NOTE — ED Notes (Signed)
[facility] with c/o dizziness, nauseous and then an "elephant sitting on her whole body" when she woke up at 2 am.  Pt reported left arm pain during transport but has since relieved on its own.  Pt states she has recently had thyroid medications changed.  Pt in NAD, A&O.

## 2014-05-31 NOTE — Discharge Instructions (Signed)
Please read and follow all provided instructions.  Your diagnoses today include:  1. Vertigo   2. Generalized weakness   3. Low TSH level     Tests performed today include:  Blood counts and electrolytes  Urine test  Test for heart muscle damage and EKG  TSH - low. Value was 0.007  Vital signs. See below for your results today.   Medications prescribed:   Valium - medication for vertigo  DO NOT drive or perform any activities that require you to be awake and alert because this medicine can make you drowsy.   Take any prescribed medications only as directed.  Home care instructions:  Follow any educational materials contained in this packet.  Follow-up instructions: Please follow-up with your primary care provider in the next 3 days for further evaluation of your symptoms. If you do not have a primary care doctor -- see below for referral information.   Return instructions:    Please return to the Emergency Department if you experience worsening symptoms.  Return if you have weakness in your arms or legs, slurred speech, trouble walking or talking, confusion, or trouble with your balance.   Please return if you have any other emergent concerns.  Additional Information:  Your vital signs today were: BP 127/67   Pulse 69   Temp(Src) 97.7 F (36.5 C) (Oral)   Resp 14   SpO2 97% If your blood pressure (BP) was elevated above 135/85 this visit, please have this repeated by your doctor within one month. --------------

## 2014-08-14 ENCOUNTER — Other Ambulatory Visit: Payer: Self-pay

## 2014-08-14 DIAGNOSIS — Z1231 Encounter for screening mammogram for malignant neoplasm of breast: Secondary | ICD-10-CM

## 2014-08-26 ENCOUNTER — Ambulatory Visit
Admission: RE | Admit: 2014-08-26 | Discharge: 2014-08-26 | Disposition: A | Discharge status: Home / Self Care | Payer: BC Managed Care – PPO | Source: Ambulatory Visit

## 2014-08-26 DIAGNOSIS — Z1231 Encounter for screening mammogram for malignant neoplasm of breast: Secondary | ICD-10-CM

## 2015-09-21 ENCOUNTER — Other Ambulatory Visit: Payer: Self-pay

## 2015-09-21 DIAGNOSIS — Z1231 Encounter for screening mammogram for malignant neoplasm of breast: Secondary | ICD-10-CM

## 2015-10-15 ENCOUNTER — Ambulatory Visit
Admission: RE | Admit: 2015-10-15 | Discharge: 2015-10-15 | Disposition: A | Discharge status: Home / Self Care | Payer: 59 | Source: Ambulatory Visit

## 2015-10-15 DIAGNOSIS — Z1231 Encounter for screening mammogram for malignant neoplasm of breast: Secondary | ICD-10-CM

## 2016-02-01 ENCOUNTER — Other Ambulatory Visit: Payer: Self-pay | Admitting: Neurosurgery

## 2016-02-01 DIAGNOSIS — M5414 Radiculopathy, thoracic region: Secondary | ICD-10-CM

## 2016-02-05 ENCOUNTER — Other Ambulatory Visit: Payer: 59

## 2016-09-28 ENCOUNTER — Other Ambulatory Visit: Payer: Self-pay | Admitting: Family Medicine

## 2016-09-28 DIAGNOSIS — Z1231 Encounter for screening mammogram for malignant neoplasm of breast: Secondary | ICD-10-CM

## 2016-10-27 ENCOUNTER — Ambulatory Visit
Admission: RE | Admit: 2016-10-27 | Discharge: 2016-10-27 | Disposition: A | Discharge status: Home / Self Care | Payer: 59 | Source: Ambulatory Visit | Attending: Family Medicine | Admitting: Family Medicine

## 2016-10-27 DIAGNOSIS — Z1231 Encounter for screening mammogram for malignant neoplasm of breast: Secondary | ICD-10-CM

## 2017-10-20 ENCOUNTER — Other Ambulatory Visit: Payer: Self-pay | Admitting: Family Medicine

## 2017-10-20 DIAGNOSIS — E2839 Other primary ovarian failure: Secondary | ICD-10-CM

## 2017-10-23 ENCOUNTER — Other Ambulatory Visit: Payer: Self-pay | Admitting: Family Medicine

## 2017-10-23 DIAGNOSIS — Z1231 Encounter for screening mammogram for malignant neoplasm of breast: Secondary | ICD-10-CM

## 2017-11-22 ENCOUNTER — Ambulatory Visit
Admission: RE | Admit: 2017-11-22 | Discharge: 2017-11-22 | Disposition: A | Discharge status: Home / Self Care | Payer: 59 | Source: Ambulatory Visit | Attending: Family Medicine | Admitting: Family Medicine

## 2017-11-22 DIAGNOSIS — Z1231 Encounter for screening mammogram for malignant neoplasm of breast: Secondary | ICD-10-CM

## 2017-11-22 DIAGNOSIS — E2839 Other primary ovarian failure: Secondary | ICD-10-CM

## 2018-11-19 ENCOUNTER — Other Ambulatory Visit: Payer: Self-pay | Admitting: Family Medicine

## 2018-11-19 DIAGNOSIS — Z1231 Encounter for screening mammogram for malignant neoplasm of breast: Secondary | ICD-10-CM

## 2019-01-15 ENCOUNTER — Other Ambulatory Visit (HOSPITAL_COMMUNITY): Payer: Self-pay | Admitting: *Deleted

## 2019-01-15 DIAGNOSIS — Z1231 Encounter for screening mammogram for malignant neoplasm of breast: Secondary | ICD-10-CM

## 2019-04-18 ENCOUNTER — Ambulatory Visit (HOSPITAL_COMMUNITY): Payer: 59

## 2019-08-06 ENCOUNTER — Ambulatory Visit
Admission: RE | Admit: 2019-08-06 | Discharge: 2019-08-06 | Disposition: A | Discharge status: Home / Self Care | Payer: No Typology Code available for payment source | Source: Ambulatory Visit | Attending: Obstetrics and Gynecology | Admitting: Obstetrics and Gynecology

## 2019-08-06 ENCOUNTER — Encounter (HOSPITAL_COMMUNITY): Payer: Self-pay

## 2019-08-06 ENCOUNTER — Other Ambulatory Visit: Payer: Self-pay

## 2019-08-06 ENCOUNTER — Ambulatory Visit (HOSPITAL_COMMUNITY)
Admission: RE | Admit: 2019-08-06 | Discharge: 2019-08-06 | Disposition: A | Discharge status: Home / Self Care | Payer: Self-pay | Source: Ambulatory Visit | Attending: Obstetrics and Gynecology | Admitting: Obstetrics and Gynecology

## 2019-08-06 DIAGNOSIS — Z1239 Encounter for other screening for malignant neoplasm of breast: Secondary | ICD-10-CM

## 2019-08-06 DIAGNOSIS — Z1231 Encounter for screening mammogram for malignant neoplasm of breast: Secondary | ICD-10-CM

## 2019-08-06 HISTORY — DX: Encounter for other screening for malignant neoplasm of breast: Z12.39

## 2019-08-06 NOTE — Progress Notes (Signed)
No complaints today.   Pap Smear: Pap smear not completed today. Last Pap smear was 20 years ago and normal per patient. Per patient has a history of an abnormal Pap smear over 30 years ago that cryotherapy was completed for follow-up. Patient has a history of a hysterectomy 18-20 years ago due to cysts and prolapse. Patient doesn't need any further Pap smears due to her history of a hysterectomy for benign reasons per BCCCP and ACOG guidelines. No Pap smear results are in Epic.  Physical exam: Breasts Breasts symmetrical. No skin abnormalities bilateral breasts. No nipple retraction bilateral breasts. No nipple discharge bilateral breasts. No lymphadenopathy. No lumps palpated bilateral breasts. No complaints of pain or tenderness on exam. Referred patient to the [facility] for a screening mammogram. Appointment scheduled for Tuesday, August 06, 2019 at 1620.        Pelvic/Bimanual No Pap smear completed today since patient has a history of a hysterectomy for benign reasons. Pap smear not indicated per BCCCP guidelines.   Smoking History: Patient has never smoked.  Patient Navigation: Patient education provided. Access to services provided for patient through [facility] program.   Colorectal Cancer Screening: Per patient had a colonoscopy completed less than 5 years ago. No complaints today.   Breast and Cervical Cancer Risk Assessment: Patient has no family history of breast cancer, known genetic mutations, or radiation treatment to the chest before age 64. Patient has no history of cervical dysplasia, immunocompromised, or DES exposure in-utero.  Risk Assessment    Risk Scores      08/06/2019   Last edited by: Armond Hang, LPN   5-year risk: 1 %   Lifetime risk: 6.6 %

## 2019-08-06 NOTE — Patient Instructions (Signed)
Explained breast self awareness with Everette Rank. Patient did not need a Pap smear today due to patient has history of a hysterectomy for benign reasons. Let patient know that she doesn't need any further Pap smears due to her history of a hysterectomy for benign reasons. Referred patient to the [facility] for a screening mammogram. Appointment scheduled for Tuesday, August 06, 2019 at 1620. Patient aware of appointment and will be there. Let patient know the Breast Center will follow up with her within the next couple weeks with results of mammogram by letter or phone. Everette Rank verbalized understanding.  Brannock, Arvil Chaco, RN 3:05 PM

## 2019-08-08 ENCOUNTER — Encounter (HOSPITAL_COMMUNITY): Payer: Self-pay | Admitting: *Deleted

## 2020-06-10 ENCOUNTER — Other Ambulatory Visit: Payer: Self-pay

## 2020-06-10 DIAGNOSIS — Z1231 Encounter for screening mammogram for malignant neoplasm of breast: Secondary | ICD-10-CM

## 2020-08-06 ENCOUNTER — Ambulatory Visit: Payer: Self-pay | Admitting: *Deleted

## 2020-08-06 ENCOUNTER — Ambulatory Visit
Admission: RE | Admit: 2020-08-06 | Discharge: 2020-08-06 | Disposition: A | Discharge status: Home / Self Care | Payer: No Typology Code available for payment source | Source: Ambulatory Visit | Attending: Obstetrics and Gynecology | Admitting: Obstetrics and Gynecology

## 2020-08-06 ENCOUNTER — Other Ambulatory Visit: Payer: Self-pay

## 2020-08-06 VITALS — BP 140/78 | Temp 97.7°F | Wt 133.3 lb

## 2020-08-06 DIAGNOSIS — Z1239 Encounter for other screening for malignant neoplasm of breast: Secondary | ICD-10-CM

## 2020-08-06 DIAGNOSIS — Z1231 Encounter for screening mammogram for malignant neoplasm of breast: Secondary | ICD-10-CM

## 2020-08-06 NOTE — Progress Notes (Signed)
[name] is a 59 y.o. female who presents to [facility] clinic today with no complaints.    Pap Smear: Pap smear not completed today. Last Pap smear was over 20 years ago and was normal per patient. Per patient has history of an abnormal Pap smear over 30 years ago that was followed up with cryotherapy. Patient has since then had a hysterectomy and therefore needs no further Pap smears. Last Pap smear result is not available in Epic.   Physical exam: Breasts Breasts symmetrical. No skin abnormalities bilateral breasts. No nipple retraction bilateral breasts. No nipple discharge bilateral breasts. No lymphadenopathy. No lumps palpated bilateral breasts. No complaints of pain or tenderness on exam.      Pelvic/Bimanual Pap is not indicated today.   Smoking History: Patient has never smoked.    Patient Navigation: Patient education provided. Access to services provided for patient through BCCCP program.   Colorectal Cancer Screening: Per patient has had a colonoscopy before and can't remember the exact date but states that her results were normal. No complaints today.    Breast and Cervical Cancer Risk Assessment: Patient does not family history of breast cancer, known genetic mutations, or radiation treatment to the chest before age 2. Patient does not have history of cervical dysplasia, immunocompromised, or DES exposure in-utero.  Risk Assessment    Risk Scores      08/06/2020 08/06/2019   Last edited by: Narda Rutherford, LPN Stoney Bang H, LPN   5-year risk: 1 % 1 %   Lifetime risk: 6.5 % 6.6 %          A: BCCCP exam without pap smear No complaints today.   P: Referred patient to the [facility] for a screening mammogram on the mobile unit. Appointment scheduled August 06, 2020 at 3:50pm.  [name], RN, FNP student 08/06/2020 3:24 PM   Attestation of Supervision of Student:  I confirm that I have verified the information documented in  the nurse practitioner student's note and that I have also personally reperformed the history, physical exam and all medical decision making activities.  I have verified that all services and findings are accurately documented in this student's note; and I agree with management and plan as outlined in the documentation. I have also made any necessary editorial changes.  Brannock, Kathaleen Maser, RN [facility] [employer], [facility] 08/06/2020 4:05 PM

## 2020-08-06 NOTE — Patient Instructions (Addendum)
Informed [name] about breast self awareness. Patient did not need a Pap smear today due to having a hysterectomy. Referred patient to the [facility] for a screening mammogram on the mobile unit. Appointment scheduled August 06, 2020 at 3:50pm. Patient escorted to mobile unit for mammogram after BCCCP appointment. Let patient know the Breast Center will follow up with her within the next couple weeks with results of her mammogram by letter or phone. [name] verbalized understanding.  [name], RN, FNP student 3:31 PM

## 2021-10-20 ENCOUNTER — Other Ambulatory Visit: Payer: Self-pay

## 2021-10-20 DIAGNOSIS — Z1231 Encounter for screening mammogram for malignant neoplasm of breast: Secondary | ICD-10-CM

## 2021-12-23 ENCOUNTER — Ambulatory Visit
Admission: RE | Admit: 2021-12-23 | Discharge: 2021-12-23 | Disposition: A | Discharge status: Home / Self Care | Payer: No Typology Code available for payment source | Source: Ambulatory Visit | Attending: Family Medicine | Admitting: Family Medicine

## 2021-12-23 ENCOUNTER — Other Ambulatory Visit: Payer: Self-pay

## 2021-12-23 ENCOUNTER — Ambulatory Visit: Payer: Self-pay | Admitting: *Deleted

## 2021-12-23 VITALS — BP 128/74 | Wt 134.4 lb

## 2021-12-23 DIAGNOSIS — Z1211 Encounter for screening for malignant neoplasm of colon: Secondary | ICD-10-CM

## 2021-12-23 DIAGNOSIS — Z1231 Encounter for screening mammogram for malignant neoplasm of breast: Secondary | ICD-10-CM

## 2021-12-23 NOTE — Patient Instructions (Signed)
Explained breast self awareness with Otilio Miu. Patient did not need a Pap smear today due to patient has a history of a hysterectomy for benign reasons. Let patient know that she doesn't need any further Pap smears due to her history of a hysterectomy for benign reasons. Referred patient to the [facility] for a screening mammogram on mobile unit. Appointment scheduled Thursday, December 23, 2021 at 1040. Patient escorted to the mobile unit following BCCCP appointment for her screening mammogram. Let patient know the Breast Center will follow up with her within the next couple weeks with results of her mammogram by letter or phone. Otilio Miu verbalized understanding.  Hawke Villalpando, Kathaleen Maser, RN 9:28 AM

## 2021-12-23 NOTE — Progress Notes (Signed)
[name] is a 61 y.o. female who presents to [facility] clinic today with no complaints.    Pap Smear: Pap smear not completed today. Last Pap smear was 22 years ago and normal per patient. Per patient has a history of an abnormal Pap smear over 32 years ago that cryotherapy was completed for follow-up. Patient has a history of a hysterectomy 20-22 years ago due to cysts and uterine prolapse. Patient doesn't need any further Pap smears due to her history of a hysterectomy for benign reasons per BCCCP and ASCCP guidelines. No Pap smear results are available in Epic.   Physical exam: Breasts No clinical breast exam completed. Visit completed through webex. Patient denied any lumps, pain, or breast discharge.   MM DIGITAL SCREENING BILATERAL  Result Date: 11/22/2017 CLINICAL DATA:  Screening. EXAM: DIGITAL SCREENING BILATERAL MAMMOGRAM WITH CAD COMPARISON:  Previous exam(s). ACR Breast Density Category c: The breast tissue is heterogeneously dense, which may obscure small masses. FINDINGS: There are no findings suspicious for malignancy. Images were processed with CAD. IMPRESSION: No mammographic evidence of malignancy. A result letter of this screening mammogram will be mailed directly to the patient. RECOMMENDATION: Screening mammogram in one year. (Code:SM-B-01Y) BI-RADS CATEGORY  1: Negative. Electronically Signed   By: Harmon Pier M.D.   On: 11/22/2017 15:11   MS DIGITAL SCREENING TOMO BILATERAL  Result Date: 08/07/2020 CLINICAL DATA:  Screening. EXAM: DIGITAL SCREENING BILATERAL MAMMOGRAM WITH TOMO AND CAD COMPARISON:  Previous exam(s). ACR Breast Density Category b: There are scattered areas of fibroglandular density. FINDINGS: There are no findings suspicious for malignancy. Images were processed with CAD. IMPRESSION: No mammographic evidence of malignancy. A result letter of this screening mammogram will be mailed directly to the patient. RECOMMENDATION: Screening mammogram in one year.  (Code:SM-B-01Y) BI-RADS CATEGORY  1: Negative. Electronically Signed   By: Amie Portland M.D.   On: 08/07/2020 16:12   MS DIGITAL SCREENING TOMO BILATERAL  Result Date: 08/07/2019 CLINICAL DATA:  Screening. EXAM: DIGITAL SCREENING BILATERAL MAMMOGRAM WITH TOMO AND CAD COMPARISON:  Previous exam(s). ACR Breast Density Category c: The breast tissue is heterogeneously dense, which may obscure small masses. FINDINGS: There are no findings suspicious for malignancy. Images were processed with CAD. IMPRESSION: No mammographic evidence of malignancy. A result letter of this screening mammogram will be mailed directly to the patient. RECOMMENDATION: Screening mammogram in one year. (Code:SM-B-01Y) BI-RADS CATEGORY  1: Negative. Electronically Signed   By: Gerome Sam III M.D   On: 08/07/2019 08:13    Pelvic/Bimanual Pap is not indicated today per BCCCP guidelines.   Smoking History: Patient has never smoked.   Patient Navigation: Patient education provided. Access to services provided for patient through BCCCP program.   Colorectal Cancer Screening: Per patient had a colonoscopy completed around 7 years ago. No complaints today.    Breast and Cervical Cancer Risk Assessment: Patient does not have family history of breast cancer, known genetic mutations, or radiation treatment to the chest before age 52. Per patient has history of cervical dysplasia. Patient has no history of being immunocompromised or DES exposure in-utero.  Risk Assessment     Risk Scores       12/23/2021 08/06/2020   Last edited by: Narda Rutherford, LPN McGill, Sherie Demetrius Charity, LPN   5-year risk: 1.1 % 1 %   Lifetime risk: 6.2 % 6.5 %            A: BCCCP exam without pap smear No complaints.  P:  Referred patient to the [facility] for a screening mammogram on mobile unit. Appointment scheduled Thursday, December 23, 2021 at 1040.  Priscille Heidelberg, RN 12/23/2021 9:28 AM

## 2022-07-25 IMAGING — MG MM DIGITAL SCREENING BILAT W/ TOMO AND CAD
6 of 10 series · 6 of 30 positions shown · non-contrast
Comparison: Previous exam(s).

CLINICAL DATA: Screening.

EXAM:
DIGITAL SCREENING BILATERAL MAMMOGRAM WITH TOMOSYNTHESIS AND CAD
TECHNIQUE: Bilateral screening digital craniocaudal and mediolateral oblique
mammograms were obtained. Bilateral screening digital breast
tomosynthesis was performed. The images were evaluated with
computer-aided detection.

[L CC synth-2D (1 of 2)]
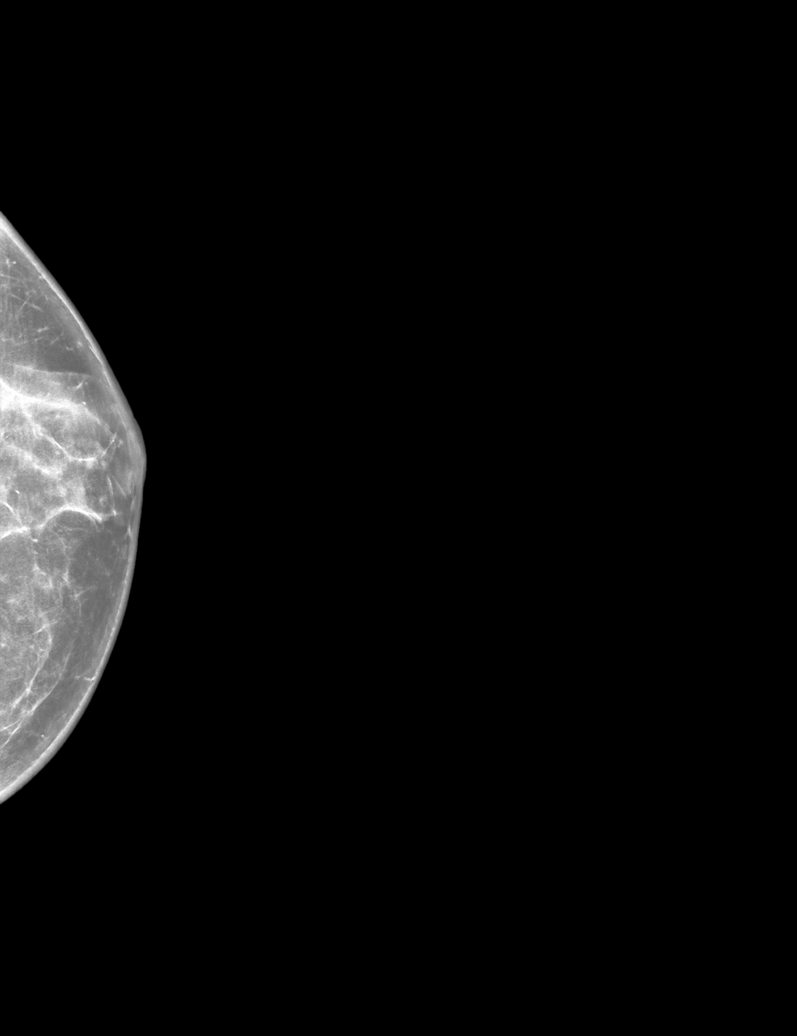

[R CC synth-2D]
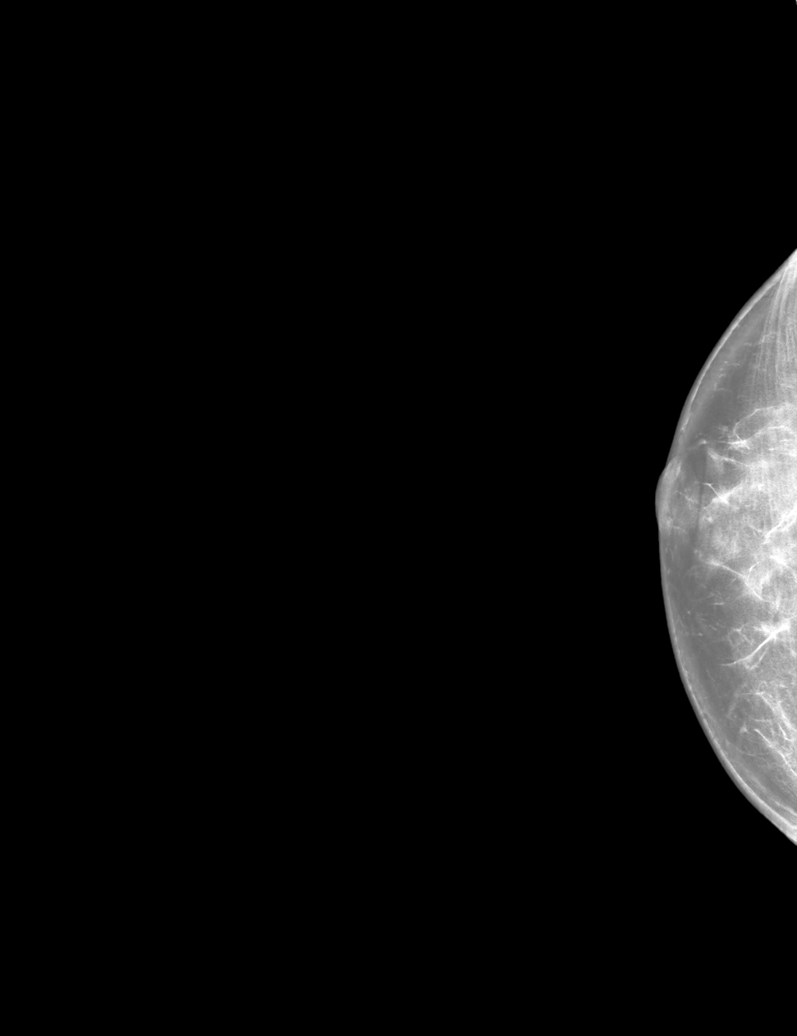

[L CC synth-2D (2 of 2)]
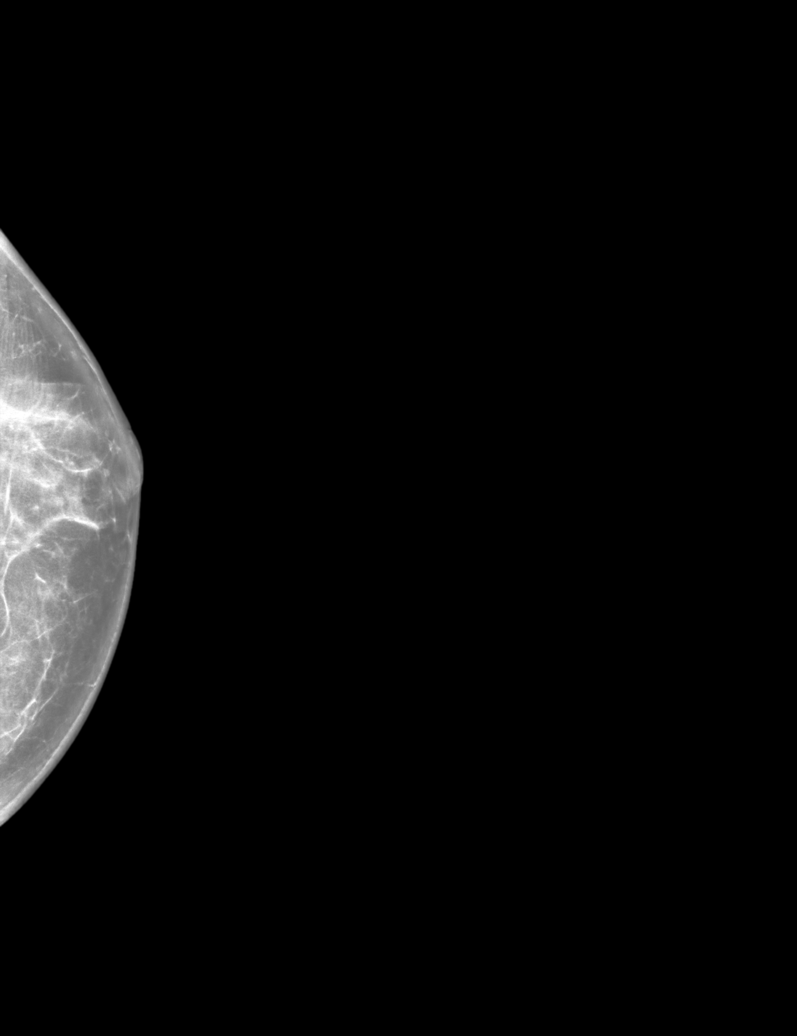

[R MLO synth-2D]
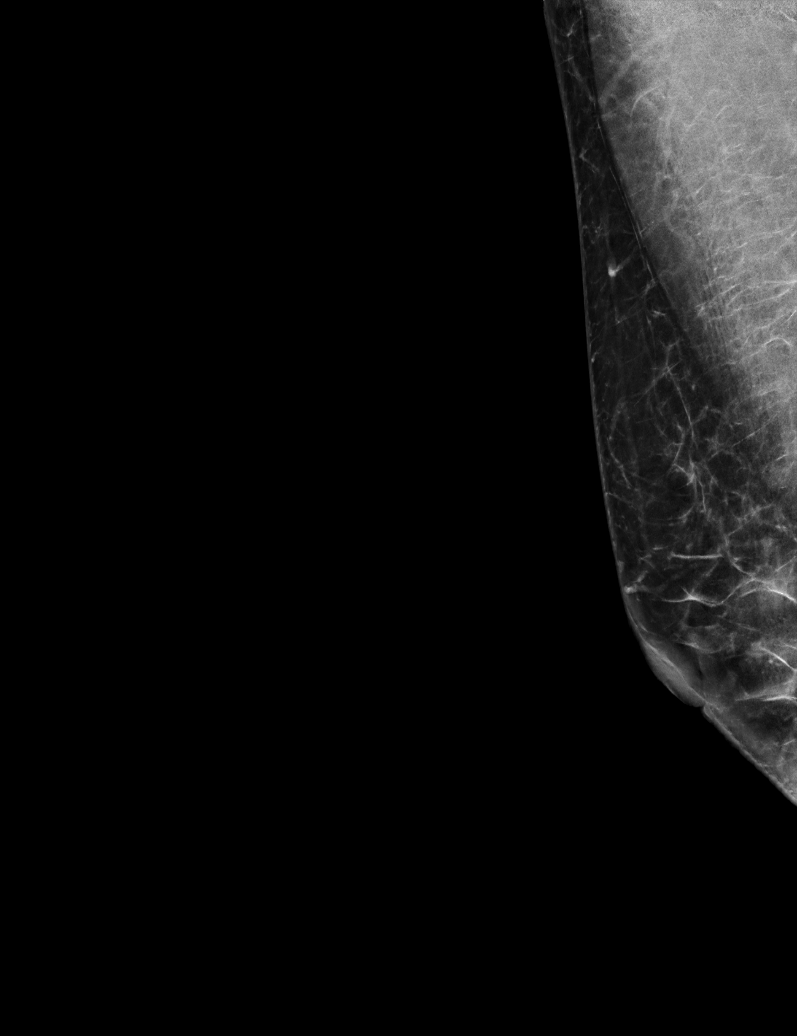

[L MLO synth-2D]
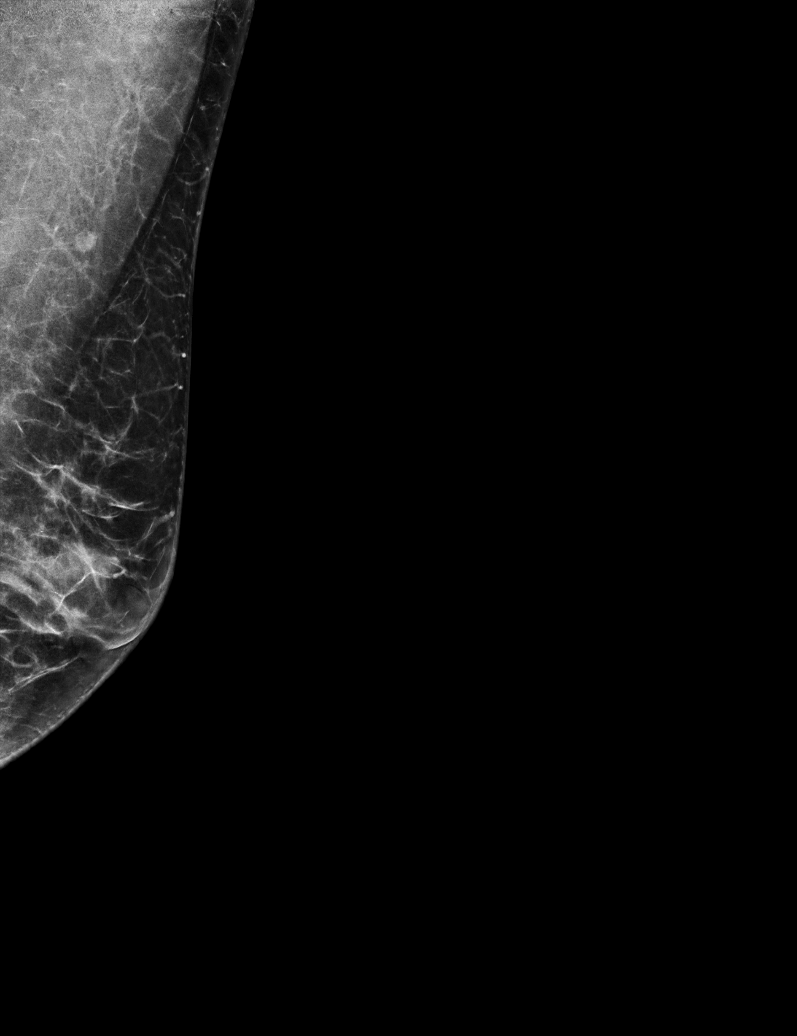

[L MLO tomo · tomo slice 31/61.0]
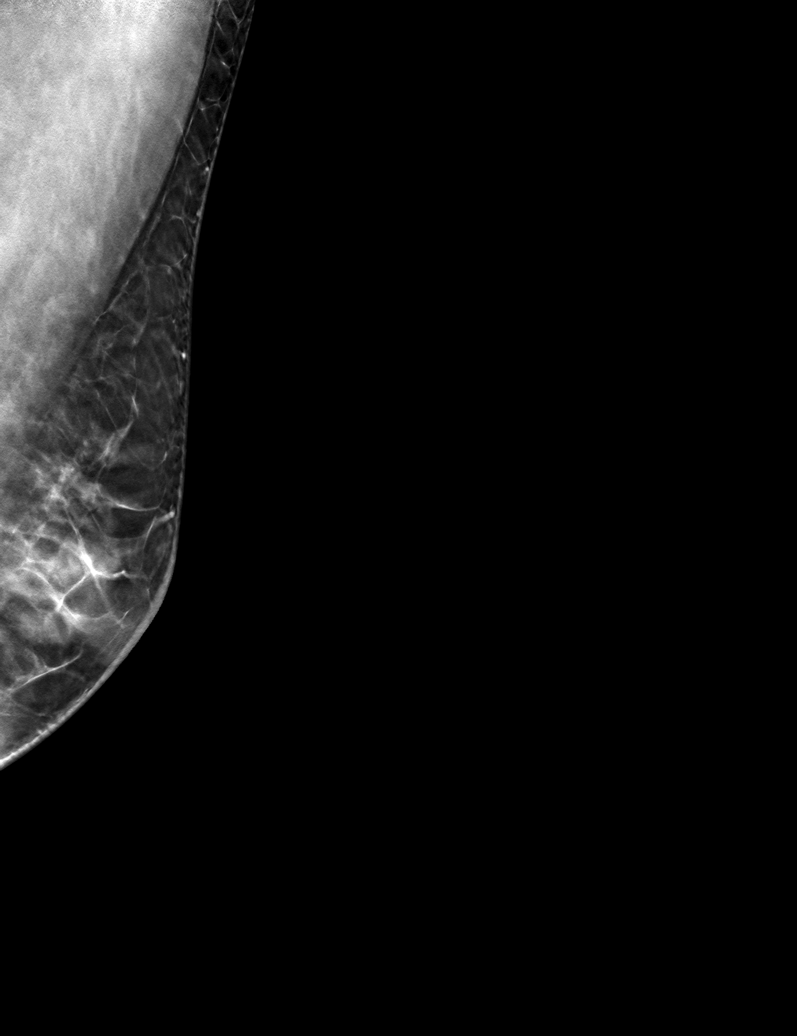

[6 of 30 positions shown; findings below may reference images not displayed]

ACR Breast Density Category c: The breast tissue is heterogeneously
dense, which may obscure small masses.
FINDINGS: There are no findings suspicious for malignancy.
IMPRESSION: No mammographic evidence of malignancy. A result letter of this
screening mammogram will be mailed directly to the patient.

RECOMMENDATION:
Screening mammogram in one year. (Code:[id number])

BI-RADS CATEGORY  1: Negative.

## 2022-12-23 ENCOUNTER — Other Ambulatory Visit: Payer: Self-pay

## 2022-12-23 DIAGNOSIS — Z1231 Encounter for screening mammogram for malignant neoplasm of breast: Secondary | ICD-10-CM

## 2023-01-17 ENCOUNTER — Inpatient Hospital Stay: Admission: RE | Admit: 2023-01-17 | Payer: No Typology Code available for payment source | Source: Ambulatory Visit

## 2023-01-17 ENCOUNTER — Ambulatory Visit: Payer: No Typology Code available for payment source

## 2023-01-17 DIAGNOSIS — S63642A Sprain of metacarpophalangeal joint of left thumb, initial encounter: Secondary | ICD-10-CM | POA: Diagnosis not present

## 2023-01-24 DIAGNOSIS — M79645 Pain in left finger(s): Secondary | ICD-10-CM | POA: Diagnosis not present

## 2023-01-24 DIAGNOSIS — S63642A Sprain of metacarpophalangeal joint of left thumb, initial encounter: Secondary | ICD-10-CM | POA: Diagnosis not present

## 2023-02-01 DIAGNOSIS — S63642A Sprain of metacarpophalangeal joint of left thumb, initial encounter: Secondary | ICD-10-CM | POA: Diagnosis not present

## 2023-02-03 ENCOUNTER — Other Ambulatory Visit: Payer: Self-pay | Admitting: Orthopedic Surgery

## 2023-02-07 ENCOUNTER — Encounter (HOSPITAL_BASED_OUTPATIENT_CLINIC_OR_DEPARTMENT_OTHER): Payer: Self-pay | Admitting: Orthopedic Surgery

## 2023-02-07 ENCOUNTER — Other Ambulatory Visit: Payer: Self-pay

## 2023-02-07 NOTE — Progress Notes (Signed)

## 2023-02-09 ENCOUNTER — Ambulatory Visit: Payer: No Typology Code available for payment source

## 2023-02-14 ENCOUNTER — Other Ambulatory Visit: Payer: Self-pay

## 2023-02-14 ENCOUNTER — Encounter (HOSPITAL_BASED_OUTPATIENT_CLINIC_OR_DEPARTMENT_OTHER)
Admission: RE | Disposition: A | Discharge status: Home / Self Care | Payer: Self-pay | Source: Home / Self Care | Attending: Orthopedic Surgery

## 2023-02-14 ENCOUNTER — Ambulatory Visit (HOSPITAL_BASED_OUTPATIENT_CLINIC_OR_DEPARTMENT_OTHER)
Admission: RE | Admit: 2023-02-14 | Discharge: 2023-02-14 | Disposition: A | Discharge status: Home / Self Care | Payer: Medicaid Other | Attending: Orthopedic Surgery | Admitting: Orthopedic Surgery

## 2023-02-14 ENCOUNTER — Ambulatory Visit (HOSPITAL_BASED_OUTPATIENT_CLINIC_OR_DEPARTMENT_OTHER): Payer: Medicaid Other

## 2023-02-14 ENCOUNTER — Encounter (HOSPITAL_BASED_OUTPATIENT_CLINIC_OR_DEPARTMENT_OTHER): Payer: Self-pay | Admitting: Orthopedic Surgery

## 2023-02-14 ENCOUNTER — Ambulatory Visit (HOSPITAL_BASED_OUTPATIENT_CLINIC_OR_DEPARTMENT_OTHER): Payer: Medicaid Other | Admitting: Certified Registered"

## 2023-02-14 DIAGNOSIS — X58XXXA Exposure to other specified factors, initial encounter: Secondary | ICD-10-CM | POA: Diagnosis not present

## 2023-02-14 DIAGNOSIS — S63642A Sprain of metacarpophalangeal joint of left thumb, initial encounter: Secondary | ICD-10-CM | POA: Diagnosis not present

## 2023-02-14 DIAGNOSIS — Z79890 Hormone replacement therapy (postmenopausal): Secondary | ICD-10-CM | POA: Diagnosis not present

## 2023-02-14 DIAGNOSIS — S5332XA Traumatic rupture of left ulnar collateral ligament, initial encounter: Secondary | ICD-10-CM | POA: Diagnosis not present

## 2023-02-14 DIAGNOSIS — E039 Hypothyroidism, unspecified: Secondary | ICD-10-CM | POA: Insufficient documentation

## 2023-02-14 DIAGNOSIS — Z79899 Other long term (current) drug therapy: Secondary | ICD-10-CM | POA: Diagnosis not present

## 2023-02-14 HISTORY — PX: ULNAR COLLATERAL LIGAMENT REPAIR: SHX6159

## 2023-02-14 SURGERY — REPAIR, LIGAMENT, ULNAR COLLATERAL
Anesthesia: Regional | Site: Thumb | Laterality: Left

## 2023-02-14 MED ORDER — VANCOMYCIN HCL IN DEXTROSE 1-5 GM/200ML-% IV SOLN
1000.0000 mg | INTRAVENOUS | Status: AC
  Administered 2023-02-14: 1000 mg via INTRAVENOUS

## 2023-02-14 MED ORDER — ACETAMINOPHEN 10 MG/ML IV SOLN: 1000.0000 mg | Freq: Once | INTRAVENOUS | Status: DC | PRN

## 2023-02-14 MED ORDER — PROPOFOL 500 MG/50ML IV EMUL
INTRAVENOUS | Status: DC | PRN
  Administered 2023-02-14: 125 ug/kg/min via INTRAVENOUS

## 2023-02-14 MED ORDER — FENTANYL CITRATE (PF) 100 MCG/2ML IJ SOLN
INTRAMUSCULAR | Status: AC
  Filled 2023-02-14: qty 2

## 2023-02-14 MED ORDER — ONDANSETRON HCL 4 MG/2ML IJ SOLN
INTRAMUSCULAR | Status: DC | PRN
  Administered 2023-02-14: 4 mg via INTRAVENOUS

## 2023-02-14 MED ORDER — MIDAZOLAM HCL 2 MG/2ML IJ SOLN
2.0000 mg | Freq: Once | INTRAMUSCULAR | Status: AC
  Administered 2023-02-14: 2 mg via INTRAVENOUS

## 2023-02-14 MED ORDER — ACETAMINOPHEN 160 MG/5ML PO SOLN: 325.0000 mg | ORAL | Status: DC | PRN

## 2023-02-14 MED ORDER — MIDAZOLAM HCL 2 MG/2ML IJ SOLN
INTRAMUSCULAR | Status: AC
  Filled 2023-02-14: qty 2

## 2023-02-14 MED ORDER — FENTANYL CITRATE (PF) 100 MCG/2ML IJ SOLN: 100.0000 ug | Freq: Once | INTRAMUSCULAR | Status: DC

## 2023-02-14 MED ORDER — LACTATED RINGERS IV SOLN: INTRAVENOUS | Status: DC

## 2023-02-14 MED ORDER — PROMETHAZINE HCL 25 MG/ML IJ SOLN: 6.2500 mg | INTRAMUSCULAR | Status: DC | PRN

## 2023-02-14 MED ORDER — BUPIVACAINE-EPINEPHRINE (PF) 0.5% -1:200000 IJ SOLN
INTRAMUSCULAR | Status: DC | PRN
  Administered 2023-02-14: 25 mL via PERINEURAL

## 2023-02-14 MED ORDER — 0.9 % SODIUM CHLORIDE (POUR BTL) OPTIME
TOPICAL | Status: DC | PRN
  Administered 2023-02-14: 75 mL

## 2023-02-14 MED ORDER — LIDOCAINE 2% (20 MG/ML) 5 ML SYRINGE
INTRAMUSCULAR | Status: DC | PRN
  Administered 2023-02-14: 20 mg via INTRAVENOUS

## 2023-02-14 MED ORDER — AMISULPRIDE (ANTIEMETIC) 5 MG/2ML IV SOLN: 10.0000 mg | Freq: Once | INTRAVENOUS | Status: DC | PRN

## 2023-02-14 MED ORDER — ACETAMINOPHEN 325 MG PO TABS: 325.0000 mg | ORAL_TABLET | ORAL | Status: DC | PRN

## 2023-02-14 MED ORDER — ACETAMINOPHEN-CODEINE 300-30 MG PO TABS: 1.0000 | ORAL_TABLET | Freq: Four times a day (QID) | ORAL | 0 refills | Status: AC | PRN

## 2023-02-14 MED ORDER — VANCOMYCIN HCL IN DEXTROSE 1-5 GM/200ML-% IV SOLN
INTRAVENOUS | Status: AC
  Filled 2023-02-14: qty 200

## 2023-02-14 MED ORDER — FENTANYL CITRATE (PF) 100 MCG/2ML IJ SOLN
25.0000 ug | INTRAMUSCULAR | Status: DC | PRN
  Administered 2023-02-14: 50 ug via INTRAVENOUS

## 2023-02-14 SURGICAL SUPPLY — 82 items
ANCH SUT 2-0 MN NDL DRL PLSTR (Anchor) ×1 IMPLANT
ANCHOR JUGGERKNOT 1.0 1DR 2-0 (Anchor) IMPLANT
APL PRP STRL LF DISP 70% ISPRP (MISCELLANEOUS) ×1
BLADE MINI RND TIP GREEN BEAV (BLADE) ×1 IMPLANT
BLADE SURG 15 STRL LF DISP TIS (BLADE) ×2 IMPLANT
BLADE SURG 15 STRL SS (BLADE) ×2
BNDG CMPR 5X2 KNTD ELC UNQ LF (GAUZE/BANDAGES/DRESSINGS)
BNDG CMPR 9X4 STRL LF SNTH (GAUZE/BANDAGES/DRESSINGS) ×1
BNDG ELASTIC 2INX 5YD STR LF (GAUZE/BANDAGES/DRESSINGS) IMPLANT
BNDG ELASTIC 3X5.8 VLCR STR LF (GAUZE/BANDAGES/DRESSINGS) IMPLANT
BNDG ESMARK 4X9 LF (GAUZE/BANDAGES/DRESSINGS) ×1 IMPLANT
BNDG GAUZE DERMACEA FLUFF 4 (GAUZE/BANDAGES/DRESSINGS) ×1 IMPLANT
BNDG GZE DERMACEA 4 6PLY (GAUZE/BANDAGES/DRESSINGS) ×1
CATH ROBINSON RED A/P 8FR (CATHETERS) IMPLANT
CHLORAPREP W/TINT 26 (MISCELLANEOUS) ×1 IMPLANT
CORD BIPOLAR FORCEPS 12FT (ELECTRODE) ×1 IMPLANT
COVER BACK TABLE 60X90IN (DRAPES) ×1 IMPLANT
COVER MAYO STAND STRL (DRAPES) ×1 IMPLANT
CUFF TOURN SGL QUICK 18X4 (TOURNIQUET CUFF) ×1 IMPLANT
DRAPE EXTREMITY T 121X128X90 (DISPOSABLE) ×1 IMPLANT
DRAPE OEC MINIVIEW 54X84 (DRAPES) IMPLANT
DRAPE SURG 17X23 STRL (DRAPES) ×1 IMPLANT
GAUZE PAD ABD 8X10 STRL (GAUZE/BANDAGES/DRESSINGS) IMPLANT
GAUZE SPONGE 4X4 12PLY STRL (GAUZE/BANDAGES/DRESSINGS) ×1 IMPLANT
GAUZE XEROFORM 1X8 LF (GAUZE/BANDAGES/DRESSINGS) ×1 IMPLANT
GLOVE BIO SURGEON STRL SZ7.5 (GLOVE) ×1 IMPLANT
GLOVE BIOGEL PI IND STRL 7.0 (GLOVE) IMPLANT
GLOVE BIOGEL PI IND STRL 7.5 (GLOVE) IMPLANT
GLOVE BIOGEL PI IND STRL 8 (GLOVE) ×1 IMPLANT
GLOVE BIOGEL PI IND STRL 8.5 (GLOVE) ×1 IMPLANT
GLOVE SURG ORTHO 8.0 STRL STRW (GLOVE) IMPLANT
GLOVE SURG SYN 7.5  E (GLOVE) ×1
GLOVE SURG SYN 7.5 E (GLOVE) ×1 IMPLANT
GLOVE SURG SYN 7.5 PF PI (GLOVE) IMPLANT
GOWN STRL REUS W/ TWL LRG LVL3 (GOWN DISPOSABLE) ×1 IMPLANT
GOWN STRL REUS W/ TWL XL LVL3 (GOWN DISPOSABLE) IMPLANT
GOWN STRL REUS W/TWL LRG LVL3 (GOWN DISPOSABLE)
GOWN STRL REUS W/TWL XL LVL3 (GOWN DISPOSABLE) ×2 IMPLANT
K-WIRE DBL .035X4 NSTRL (WIRE)
KWIRE DBL .035X4 NSTRL (WIRE) IMPLANT
NDL HYPO 22X1.5 SAFETY MO (MISCELLANEOUS) IMPLANT
NDL HYPO 25X1 1.5 SAFETY (NEEDLE) IMPLANT
NDL KEITH (NEEDLE) IMPLANT
NDL SAFETY ECLIP 18X1.5 (MISCELLANEOUS) IMPLANT
NEEDLE HYPO 22X1.5 SAFETY MO (MISCELLANEOUS) IMPLANT
NEEDLE HYPO 25X1 1.5 SAFETY (NEEDLE) ×1 IMPLANT
NEEDLE KEITH (NEEDLE) IMPLANT
NEEDLE SAFETY HYPO 22GAX1.5 (MISCELLANEOUS)
NS IRRIG 1000ML POUR BTL (IV SOLUTION) ×1 IMPLANT
PACK BASIN DAY SURGERY FS (CUSTOM PROCEDURE TRAY) ×1 IMPLANT
PAD CAST 3X4 CTTN HI CHSV (CAST SUPPLIES) ×1 IMPLANT
PAD CAST 4YDX4 CTTN HI CHSV (CAST SUPPLIES) IMPLANT
PADDING CAST ABS COTTON 4X4 ST (CAST SUPPLIES) ×1 IMPLANT
PADDING CAST COTTON 3X4 STRL (CAST SUPPLIES) ×1
PADDING CAST COTTON 4X4 STRL (CAST SUPPLIES)
PASSER SUT SWANSON 36MM LOOP (INSTRUMENTS) IMPLANT
SLEEVE SCD COMPRESS KNEE MED (STOCKING) ×1 IMPLANT
SLING ARM FOAM STRAP LRG (SOFTGOODS) IMPLANT
SLING ARM FOAM STRAP MED (SOFTGOODS) IMPLANT
SPIKE FLUID TRANSFER (MISCELLANEOUS) IMPLANT
SPLINT PLASTER CAST FAST 5X30 (CAST SUPPLIES) IMPLANT
SPLINT PLASTER CAST XFAST 3X15 (CAST SUPPLIES) IMPLANT
STOCKINETTE 4X48 STRL (DRAPES) ×1 IMPLANT
SUT CHROMIC 4 0 P 3 18 (SUTURE) IMPLANT
SUT ETHIBOND 3-0 V-5 (SUTURE) IMPLANT
SUT ETHILON 3 0 PS 1 (SUTURE) IMPLANT
SUT ETHILON 4 0 PS 2 18 (SUTURE) IMPLANT
SUT FIBERWIRE 2-0 18 17.9 3/8 (SUTURE)
SUT FIBERWIRE 3-0 18 TAPR NDL (SUTURE)
SUT MERSILENE 2.0 SH NDLE (SUTURE) IMPLANT
SUT MERSILENE 4 0 P 3 (SUTURE) IMPLANT
SUT SILK 4 0 PS 2 (SUTURE) IMPLANT
SUT VIC AB 2-0 SH 27 (SUTURE)
SUT VIC AB 2-0 SH 27XBRD (SUTURE) IMPLANT
SUT VICRYL 0 SH 27 (SUTURE) IMPLANT
SUT VICRYL 4-0 PS2 18IN ABS (SUTURE) IMPLANT
SUTURE FIBERWR 2-0 18 17.9 3/8 (SUTURE) IMPLANT
SUTURE FIBERWR 3-0 18 TAPR NDL (SUTURE) IMPLANT
SYR BULB EAR ULCER 3OZ GRN STR (SYRINGE) ×1 IMPLANT
SYR CONTROL 10ML LL (SYRINGE) IMPLANT
TOWEL GREEN STERILE FF (TOWEL DISPOSABLE) ×2 IMPLANT
UNDERPAD 30X36 HEAVY ABSORB (UNDERPADS AND DIAPERS) ×1 IMPLANT

## 2023-02-14 NOTE — Anesthesia Procedure Notes (Signed)
Anesthesia Regional Block: Supraclavicular block   Pre-Anesthetic Checklist: , timeout performed,  Correct Patient, Correct Site, Correct Laterality,  Correct Procedure, Correct Position, site marked,  Risks and benefits discussed,  Surgical consent,  Pre-op evaluation,  At surgeon's request and post-op pain management  Laterality: Left  Prep: chloraprep       Needles:  Injection technique: Single-shot  Needle Type: Echogenic Stimulator Needle     Needle Length: 9cm  Needle Gauge: 21     Additional Needles:   Procedures:,,,, ultrasound used (permanent image in chart),,    Narrative:  Start time: 02/14/2023 12:15 PM End time: 02/14/2023 12:20 PM Injection made incrementally with aspirations every 5 mL.  Performed by: Personally  Anesthesiologist: Effie Berkshire, MD  Additional Notes: Discussed risks and benefits of the nerve block in detail, including but not limited vascular injury, permanent nerve damage and infection.   Patient tolerated the procedure well. Local anesthetic introduced in an incremental fashion under minimal resistance after negative aspirations. No paresthesias were elicited. After completion of the procedure, no acute issues were identified and patient continued to be monitored by RN.

## 2023-02-14 NOTE — Op Note (Signed)
NAME: [name] MEDICAL RECORD NO: [id number] DATE OF BIRTH: 1961/05/23 FACILITY: [facility] LOCATION: [facility] PHYSICIAN: Tami Ribas, MD   OPERATIVE REPORT   DATE OF PROCEDURE: 02/14/23    PREOPERATIVE DIAGNOSIS: Left thumb ulnar collateral ligament tear   POSTOPERATIVE DIAGNOSIS: Left thumb ulnar collateral ligament tear   PROCEDURE: Repair of left thumb ulnar collateral ligament   SURGEON:  Betha Loa, M.D.   ASSISTANT: Cindee Salt, MD   ANESTHESIA:  Regional with sedation   INTRAVENOUS FLUIDS:  Per anesthesia flow sheet.   ESTIMATED BLOOD LOSS:  Minimal.   COMPLICATIONS:  None.   SPECIMENS:  none   TOURNIQUET TIME:    Total Tourniquet Time Documented: Upper Arm (Left) - 28 minutes Total: Upper Arm (Left) - 28 minutes    DISPOSITION:  Stable to PACU.   INDICATIONS: 62 year old female with a tear of ulnar collateral ligament left thumb.  This confirmed on MRI.  She has tried splinting.  She wishes to proceed with operative repair.  Risks, benefits and alternatives of surgery were discussed including the risks of blood loss, infection, damage to nerves, vessels, tendons, ligaments, bone for surgery, need for additional surgery, complications with wound healing, continued pain, stiffness.  She voiced understanding of these risks and elected to proceed.  OPERATIVE COURSE:  After being identified preoperatively by myself,  the patient and I agreed on the procedure and site of the procedure.  The surgical site was marked.  Surgical consent had been signed. Preoperative IV antibiotic prophylaxis was given. She was transferred to the operating room and placed on the operating table in supine position with the Left upper extremity on an arm board.  Sedation was induced by the anesthesiologist. A regional block had been performed by anesthesia in preoperative holding.    Left upper extremity was prepped and draped in normal sterile orthopedic fashion.  A  surgical pause was performed between the surgeons, anesthesia, and operating room staff and all were in agreement as to the patient, procedure, and site of procedure.  Tourniquet at the proximal aspect of the extremity was inflated to 250 mmHg after exsanguination of the arm with an Esmarch bandage.  Incision was made at the dorsum of the MP joint of the left thumb.  This was carried in subcutaneous tissues by spreading technique.  Bipolar electrocautery was used to obtain hemostasis.  The doctor aponeurosis was released to allow visualization.  The capsule was sharply incised at the dorsal ulnar aspect of the thumb.  The collateral ligament was identified.  It was attached at the insertion on the proximal phalanx.  It was almost entirely avulsed from the origin at the metacarpal head.  The remaining fibers were released.  The origin was cleaned up with the curette.  A juggernaut suture anchor was selected.  The guidepin was placed into the footprint at the metacarpal.  The anchor was placed.  There was good purchase.  The 2 arms of suture were passed through the collateral ligament and used to secure it back into the footprint on the metacarpal head.  There was good stability to the ulnar collateral admit with testing.  The wound was copiously irrigated with sterile saline.  The capsule was repaired with a 4-0 chromic in a running fashion.  The adductor aponeurosis was repaired with a running 4-0 [place] suture and the skin was closed with 4-0 nylon in a horizontal mattress fashion.  The wound was dressed with sterile Xeroform 4 x 4's and  wrapped with a Kerlix bandage.  Thumb spica splint was placed and wrapped with Kerlix and Ace bandage.  The tourniquet was deflated at 28 minutes.  Fingertips were pink with brisk capillary refill after deflation of tourniquet.  The operative  drapes were broken down.  The patient was awoken from anesthesia safely.  She was transferred back to the stretcher and taken to PACU in  stable condition.  I will see her back in the office in 1 week for postoperative followup.  I will give her a prescription for Tylenol 3 1 p.o. every 6 hours as needed pain dispense #20.   Betha Loa, MD Electronically signed, 02/14/23

## 2023-02-14 NOTE — Anesthesia Postprocedure Evaluation (Signed)
Anesthesia Post Note  Patient: [name]  Procedure(s) Performed: LEFT THUMB ULNAR COLLATERAL LIGAMENT REPAIR (Left: Thumb)     Patient location during evaluation: PACU Anesthesia Type: Regional Level of consciousness: awake and alert Pain management: pain level controlled Vital Signs Assessment: post-procedure vital signs reviewed and stable Respiratory status: spontaneous breathing, nonlabored ventilation, respiratory function stable and patient connected to nasal cannula oxygen Cardiovascular status: stable and blood pressure returned to baseline Postop Assessment: no apparent nausea or vomiting Anesthetic complications: no  No notable events documented.  Last Vitals:  Vitals:   02/14/23 1430 02/14/23 1440  BP: (!) 147/79 (!) 143/74  Pulse: 65 73  Resp: 19 16  Temp: 36.5 C 36.5 C  SpO2: 96% 98%    Last Pain:  Vitals:   02/14/23 1440  TempSrc:   PainSc: 0-No pain                 Effie Berkshire

## 2023-02-14 NOTE — Anesthesia Procedure Notes (Signed)
Procedure Name: MAC Date/Time: 02/14/2023 1:19 PM  Performed by: Signe Colt, CRNAPre-anesthesia Checklist: Patient identified, Emergency Drugs available, Suction available, Patient being monitored and Timeout performed Patient Re-evaluated:Patient Re-evaluated prior to induction Oxygen Delivery Method: Simple face mask

## 2023-02-14 NOTE — Op Note (Signed)
I assisted Surgeon(s) and Role:    * Leanora Cover, MD - Primary    * Daryll Brod, MD - Assisting on the Procedure(s): LEFT THUMB [place] on 02/14/2023.  I provided assistance on this case as follows:, Set up, approach, i neurotomy of the joint, identification of the avulsion of the ligament, placement of anchor, repair of the ligament, repair extensor tendon closure of the wound application dressing and splint.  Electronically signed by: Daryll Brod, MD Date: 02/14/2023 Time: 1:49 PM

## 2023-02-14 NOTE — Transfer of Care (Signed)
Immediate Anesthesia Transfer of Care Note  Patient: [name]  Procedure(s) Performed: LEFT THUMB ULNAR COLLATERAL LIGAMENT REPAIR (Left: Thumb)  Patient Location: PACU  Anesthesia Type:MAC combined with regional for post-op pain  Level of Consciousness: awake, alert , oriented, and patient cooperative  Airway & Oxygen Therapy: Patient Spontanous Breathing and Patient connected to face mask oxygen  Post-op Assessment: Report given to RN and Post -op Vital signs reviewed and stable  Post vital signs: Reviewed and stable  Last Vitals:  Vitals Value Taken Time  BP    Temp    Pulse 73 02/14/23 1351  Resp 25 02/14/23 1351  SpO2 99 % 02/14/23 1351  Vitals shown include unvalidated device data.  Last Pain:  Vitals:   02/14/23 1106  TempSrc: Oral  PainSc: 2       Patients Stated Pain Goal: 2 ([id number])  Complications: No notable events documented.

## 2023-02-14 NOTE — Discharge Instructions (Addendum)

## 2023-02-14 NOTE — Anesthesia Preprocedure Evaluation (Addendum)
Anesthesia Evaluation  Patient identified by MRN, date of birth, ID band Patient awake    Reviewed: Allergy & Precautions, NPO status , Patient's Chart, lab work & pertinent test results  History of Anesthesia Complications (+) PONV and history of anesthetic complications  Airway Mallampati: I  TM Distance: >3 FB Neck ROM: Full    Dental  (+) Teeth Intact, Dental Advisory Given   Pulmonary neg pulmonary ROS   breath sounds clear to auscultation       Cardiovascular negative cardio ROS  Rhythm:Regular Rate:Normal     Neuro/Psych  negative psych ROS   GI/Hepatic negative GI ROS, Neg liver ROS,,,  Endo/Other  Hypothyroidism    Renal/GU negative Renal ROS     Musculoskeletal negative musculoskeletal ROS (+)    Abdominal   Peds  Hematology negative hematology ROS (+)   Anesthesia Other Findings   Reproductive/Obstetrics                             Anesthesia Physical Anesthesia Plan  ASA: 2  Anesthesia Plan: Regional   Post-op Pain Management:    Induction: Intravenous  PONV Risk Score and Plan: 3 and Ondansetron, Midazolam and Propofol infusion  Airway Management Planned: Natural Airway and Simple Face Mask  Additional Equipment: None  Intra-op Plan:   Post-operative Plan:   Informed Consent: I have reviewed the patients History and Physical, chart, labs and discussed the procedure including the risks, benefits and alternatives for the proposed anesthesia with the patient or authorized representative who has indicated his/her understanding and acceptance.       Plan Discussed with: CRNA  Anesthesia Plan Comments:        Anesthesia Quick Evaluation

## 2023-02-14 NOTE — Progress Notes (Signed)
Assisted Dr. Smith Robert with left, supraclavicular, ultrasound guided block. Side rails up, monitors on throughout procedure. See vital signs in flow sheet. Tolerated Procedure well.

## 2023-02-14 NOTE — H&P (Signed)
[name] is an 62 y.o. female.   Chief Complaint: collateral ligament tear HPI: 62 yo female with left thumb ulnar collateral ligament tear.  MRI shows tear from metacarpal head.  She has tried splinting.  She wishes to proceed with operative repair.  Allergies:  Allergies  Allergen Reactions   Ancef [Cefazolin] Swelling   Hydrocodone Nausea And Vomiting   Iohexol      Desc: PT HAD RECATION TO IV CONTRAST MEDIA IN THE 1990'S PT HAD FACIAL SWELLING, Onset Date: [id number]    Percocet [Oxycodone-Acetaminophen] Nausea And Vomiting    Past Medical History:  Diagnosis Date   Contact lens/glasses fitting    wears contacts or glasses   Hypothyroidism    PONV (postoperative nausea and vomiting)    Thyroid condition     Past Surgical History:  Procedure Laterality Date   ABDOMINAL HYSTERECTOMY     CESAREAN SECTION     x2   CHOLECYSTECTOMY     I & D EXTREMITY Right 03/07/2013   Procedure: IRRIGATION AND DEBRIDEMENT RIGHT LONG FINGER;  Surgeon: Tennis Must, MD;  Location: [facility];  Service: Orthopedics;  Laterality: Right;   TONSILLECTOMY     TUBAL LIGATION     WRIST ARTHROSCOPY  2011x2   tendon repair -left   WRIST ARTHROSCOPY     rt    Family History: Family History  Problem Relation Age of Onset   Hypertension Mother    Hypertension Father    Breast cancer Neg Hx     Social History:   reports that she has never smoked. She has never used smokeless tobacco. She reports current alcohol use. She reports that she does not use drugs.  Medications: Medications Prior to Admission  Medication Sig Dispense Refill   ARMOUR THYROID 60 MG tablet Take 120 mg by mouth daily.     aspirin EC 81 MG tablet Take 81 mg by mouth daily. Swallow whole.     Estradiol-Estriol-Progesterone (BIEST/PROGESTERONE) CREA Place 1 application onto the skin daily.     Misc Natural Products (NF FORMULAS TESTOSTERONE PO) Apply 1 application topically every other day.      naltrexone (DEPADE) 50 MG tablet Take 50 mg by mouth at bedtime.     Nutritional Supplements (JUICE PLUS FIBRE PO) Take by mouth.     Progesterone Micronized (PROGESTERONE PO) Take 150 mg by mouth daily.     Cholecalciferol (VITAMIN D PO) Take 1 tablet by mouth 2 (two) times a week.     Coenzyme Q10 (CO Q 10 PO) Take 1 capsule by mouth 2 (two) times a week.     Cyanocobalamin (B-12 PO) Take 1 tablet by mouth 2 (two) times a week.     diazepam (VALIUM) 5 MG tablet Take 1 tablet (5 mg total) by mouth 2 (two) times daily. (Patient not taking: Reported on 08/06/2019) 6 tablet 0   liothyronine (CYTOMEL) 25 MCG tablet Take 25 mcg by mouth daily.     Nutritional Supplements (DHEA PO) Take 5 mg by mouth 2 (two) times a week.     Red Yeast Rice Extract (RED YEAST RICE PO) Take 1 capsule by mouth daily.     SYNTHROID 50 MCG tablet Take 50 mcg by mouth daily. (Patient not taking: Reported on 08/06/2020)      No results found for this or any previous visit (from the past 48 hour(s)).  DG MINI C-ARM IMAGE ONLY  Result Date: 02/14/2023 There is no interpretation for this  exam.  This order is for images obtained during a surgical procedure.  Please See "Surgeries" Tab for more information regarding the procedure.      Blood pressure (!) 156/72, pulse 83, temperature 98.1 F (36.7 C), temperature source Oral, height '5\' 6"'$  (1.676 m), weight 59.3 kg, SpO2 100 %.  General appearance: alert, cooperative, and appears stated age Head: Normocephalic, without obvious abnormality, atraumatic Neck: supple, symmetrical, trachea midline Extremities: Intact sensation and capillary refill all digits.  +epl/fpl/io.  No wounds.  Pulses: 2+ and symmetric Skin: Skin color, texture, turgor normal. No rashes or lesions Neurologic: Grossly normal Incision/Wound: none  Assessment/Plan Left thumb ulnar collateral ligament tear.  Non operative and operative treatment options have been discussed with the patient and patient  wishes to proceed with operative treatment. Risks, benefits, and alternatives of surgery have been discussed and the patient agrees with the plan of care.   [name] 02/14/2023, 12:58 PM

## 2023-02-15 ENCOUNTER — Encounter (HOSPITAL_BASED_OUTPATIENT_CLINIC_OR_DEPARTMENT_OTHER): Payer: Self-pay | Admitting: Orthopedic Surgery

## 2023-03-09 ENCOUNTER — Ambulatory Visit: Payer: Self-pay | Admitting: Hematology and Oncology

## 2023-03-09 ENCOUNTER — Ambulatory Visit
Admission: RE | Admit: 2023-03-09 | Discharge: 2023-03-09 | Disposition: A | Discharge status: Home / Self Care | Payer: No Typology Code available for payment source | Source: Ambulatory Visit | Attending: Obstetrics and Gynecology | Admitting: Obstetrics and Gynecology

## 2023-03-09 VITALS — BP 164/84 | Wt 132.0 lb

## 2023-03-09 DIAGNOSIS — Z1231 Encounter for screening mammogram for malignant neoplasm of breast: Secondary | ICD-10-CM

## 2023-03-09 NOTE — Patient Instructions (Signed)
[facility] [place] about self breast awareness and gave educational materials to take home. Patient did not need a Pap smear today due to hysterectomy for AUB. Referred patient to the [facility] for screening mammogram. Appointment scheduled for 03/09/23. Patient aware of appointment and will be there. Let patient know will follow up with her within the next couple weeks with results. Everette Rank verbalized understanding.  Melodye Ped, NP 10:22 AM

## 2023-03-09 NOTE — Progress Notes (Signed)
[name] is a 62 y.o. female who presents to [facility] clinic today with no complaints.    Pap Smear: Pap not smear completed today due to hysterectomy.    Physical exam: Breasts Breasts symmetrical. No skin abnormalities bilateral breasts. No nipple retraction bilateral breasts. No nipple discharge bilateral breasts. No lymphadenopathy. No lumps palpated bilateral breasts.  MM 3D SCREEN BREAST BILATERAL  Result Date: 12/24/2021 CLINICAL DATA:  Screening. EXAM: DIGITAL SCREENING BILATERAL MAMMOGRAM WITH TOMOSYNTHESIS AND CAD TECHNIQUE: Bilateral screening digital craniocaudal and mediolateral oblique mammograms were obtained. Bilateral screening digital breast tomosynthesis was performed. The images were evaluated with computer-aided detection. COMPARISON:  Previous exam(s). ACR Breast Density Category c: The breast tissue is heterogeneously dense, which may obscure small masses. FINDINGS: There are no findings suspicious for malignancy. IMPRESSION: No mammographic evidence of malignancy. A result letter of this screening mammogram will be mailed directly to the patient. RECOMMENDATION: Screening mammogram in one year. (Code:SM-B-01Y) BI-RADS CATEGORY  1: Negative. Electronically Signed   By: Dorise Bullion III M.D.   On: 12/24/2021 12:19   MS DIGITAL SCREENING TOMO BILATERAL  Result Date: 08/07/2020 CLINICAL DATA:  Screening. EXAM: DIGITAL SCREENING BILATERAL MAMMOGRAM WITH TOMO AND CAD COMPARISON:  Previous exam(s). ACR Breast Density Category b: There are scattered areas of fibroglandular density. FINDINGS: There are no findings suspicious for malignancy. Images were processed with CAD. IMPRESSION: No mammographic evidence of malignancy. A result letter of this screening mammogram will be mailed directly to the patient. RECOMMENDATION: Screening mammogram in one year. (Code:SM-B-01Y) BI-RADS CATEGORY  1: Negative. Electronically Signed   By: Lajean Manes M.D.   On: 08/07/2020 16:12   MS  DIGITAL SCREENING TOMO BILATERAL  Result Date: 08/07/2019 CLINICAL DATA:  Screening. EXAM: DIGITAL SCREENING BILATERAL MAMMOGRAM WITH TOMO AND CAD COMPARISON:  Previous exam(s). ACR Breast Density Category c: The breast tissue is heterogeneously dense, which may obscure small masses. FINDINGS: There are no findings suspicious for malignancy. Images were processed with CAD. IMPRESSION: No mammographic evidence of malignancy. A result letter of this screening mammogram will be mailed directly to the patient. RECOMMENDATION: Screening mammogram in one year. (Code:SM-B-01Y) BI-RADS CATEGORY  1: Negative. Electronically Signed   By: Dorise Bullion III M.D   On: 08/07/2019 08:13         Pelvic/Bimanual Pap is not indicated today    Smoking History: Patient has never smoked and was not referred to quit line.    Patient Navigation: Patient education provided. Access to services provided for patient through [facility] program. No interpreter provided. No transportation provided   Colorectal Cancer Screening: Per patient has had colonoscopy completed on 2013  No complaints today. She will call [facility] for follow up and if unable to be scheduled, will call us for FIT test.    Breast and Cervical Cancer Risk Assessment: Patient does not have family history of breast cancer, known genetic mutations, or radiation treatment to the chest before age 38. Patient does not have history of cervical dysplasia, immunocompromised, or DES exposure in-utero.  Risk Scores as of 03/09/2023     [name]           5-year 0.94 %   Lifetime 4.87 %            Last calculated by Claretha Cooper, CMA on 03/09/2023 at  9:56 AM        A: [facility] exam without pap smear No complaints with benign exam.   P: Referred patient to the [facility] [place] for a screening mammogram. Appointment scheduled 03/09/23.  Melodye Ped, NP 03/09/2023 10:19 AM

## 2023-03-20 DIAGNOSIS — Z419 Encounter for procedure for purposes other than remedying health state, unspecified: Secondary | ICD-10-CM | POA: Diagnosis not present

## 2023-03-29 DIAGNOSIS — M25642 Stiffness of left hand, not elsewhere classified: Secondary | ICD-10-CM | POA: Diagnosis not present

## 2023-03-29 DIAGNOSIS — M25542 Pain in joints of left hand: Secondary | ICD-10-CM | POA: Diagnosis not present

## 2023-03-29 DIAGNOSIS — S63642D Sprain of metacarpophalangeal joint of left thumb, subsequent encounter: Secondary | ICD-10-CM | POA: Diagnosis not present

## 2023-04-06 DIAGNOSIS — S63642D Sprain of metacarpophalangeal joint of left thumb, subsequent encounter: Secondary | ICD-10-CM | POA: Diagnosis not present

## 2023-04-06 DIAGNOSIS — M25542 Pain in joints of left hand: Secondary | ICD-10-CM | POA: Diagnosis not present

## 2023-04-06 DIAGNOSIS — M25642 Stiffness of left hand, not elsewhere classified: Secondary | ICD-10-CM | POA: Diagnosis not present

## 2023-04-12 DIAGNOSIS — M25542 Pain in joints of left hand: Secondary | ICD-10-CM | POA: Diagnosis not present

## 2023-04-12 DIAGNOSIS — M25642 Stiffness of left hand, not elsewhere classified: Secondary | ICD-10-CM | POA: Diagnosis not present

## 2023-04-12 DIAGNOSIS — S63642D Sprain of metacarpophalangeal joint of left thumb, subsequent encounter: Secondary | ICD-10-CM | POA: Diagnosis not present

## 2023-04-19 DIAGNOSIS — Z419 Encounter for procedure for purposes other than remedying health state, unspecified: Secondary | ICD-10-CM | POA: Diagnosis not present

## 2023-05-20 DIAGNOSIS — Z419 Encounter for procedure for purposes other than remedying health state, unspecified: Secondary | ICD-10-CM | POA: Diagnosis not present

## 2023-06-19 DIAGNOSIS — Z419 Encounter for procedure for purposes other than remedying health state, unspecified: Secondary | ICD-10-CM | POA: Diagnosis not present

## 2023-07-10 ENCOUNTER — Ambulatory Visit (INDEPENDENT_AMBULATORY_CARE_PROVIDER_SITE_OTHER): Payer: Medicare Other | Admitting: Family Medicine

## 2023-07-10 ENCOUNTER — Encounter: Payer: Self-pay | Admitting: Family Medicine

## 2023-07-10 VITALS — BP 144/78 | HR 83 | Ht 66.0 in | Wt 130.1 lb

## 2023-07-10 DIAGNOSIS — E063 Autoimmune thyroiditis: Secondary | ICD-10-CM | POA: Diagnosis not present

## 2023-07-10 DIAGNOSIS — E785 Hyperlipidemia, unspecified: Secondary | ICD-10-CM

## 2023-07-10 DIAGNOSIS — I10 Essential (primary) hypertension: Secondary | ICD-10-CM | POA: Insufficient documentation

## 2023-07-10 DIAGNOSIS — Z23 Encounter for immunization: Secondary | ICD-10-CM | POA: Diagnosis not present

## 2023-07-10 DIAGNOSIS — E038 Other specified hypothyroidism: Secondary | ICD-10-CM | POA: Diagnosis not present

## 2023-07-10 HISTORY — DX: Autoimmune thyroiditis: E06.3

## 2023-07-10 HISTORY — DX: Hyperlipidemia, unspecified: E78.5

## 2023-07-10 NOTE — Patient Instructions (Addendum)
It was nice to see you today,  We addressed the following topics today: - we have ordered some tests to check thyroid and cholesterol.   - we have ordered an ultrasound to evaluate your high blood pressure - follow up in one month to recheck blood pressure - I will get records from your provider in [place].   Have a great day,  Frederic Jericho, MD

## 2023-07-10 NOTE — Assessment & Plan Note (Signed)
At normal weight.  Exercises.  Eats healthy.  Non smoker. Family hx of htn.  Pt wants further workup prior to starting medication - f/u bmp and RAA - RAS u/s ordered.

## 2023-07-10 NOTE — Assessment & Plan Note (Signed)
F/u TSH and t4.  Managed by NP in [place].

## 2023-07-10 NOTE — Assessment & Plan Note (Addendum)
F/u lipid panel

## 2023-07-10 NOTE — Progress Notes (Signed)
New Patient Office Visit  Subjective    Patient ID: [name], female    DOB: 09-02-1961  Age: 62 y.o. MRN: [id number]  CC:  Chief Complaint  Patient presents with   New Patient (Initial Visit)    HPI [name] presents to establish care Was previously seeing Candace smith at [place]. Also Goes to an integrative NP, [facility],  in [place]. This is who prescribes her thyroid medication. She is also on estrogen cream, testosterone cream, and progesterone capsule after her hysterectomy.    She takes levothyroxine , liothyronine daily.  Was on armoud thyroid in the past prior to this.    Also takes 'low dose naltrexone' for her thyroid.    Htn - pt is concerned about her blood pressure.  States it has been increasing in the past year.  She has a family hx of cad, cva, and htn and would like a thorough workup of hypertension prior to starting medication.   Patient states she has had reactions to contrast in the past in which her eyes swelled up.     PMH: hypothyroid, htn  PSH: hysterectomy  FH: cva, htn, cad  Tobacco use: no Alcohol use: some  Drug use: no Marital status: married.  2 children, adults.  1 grandchild.   Employment: none Sexual hx: active with husband.   Screenings:  Colon Cancer: needs a new one.  Had one at 62 yo.   Breast Cancer: mammograms - yearly     Outpatient Encounter Medications as of 07/10/2023  Medication Sig   acetaminophen-codeine (TYLENOL #3) 300-30 MG tablet Take 1 tablet by mouth every 6 (six) hours as needed for moderate pain.   ARMOUR THYROID 60 MG tablet Take 120 mg by mouth daily.   aspirin EC 81 MG tablet Take 81 mg by mouth daily. Swallow whole.   Estradiol-Estriol-Progesterone (BIEST/PROGESTERONE) CREA Place 1 application onto the skin daily.   hydrochlorothiazide (HYDRODIURIL) 12.5 MG tablet Take by mouth.   levothyroxine (SYNTHROID) 50 MCG tablet Take 1 tablet by mouth daily before breakfast.    liothyronine (CYTOMEL) 5 MCG tablet Take 10 mcg by mouth daily.   naltrexone (DEPADE) 50 MG tablet Take 50 mg by mouth at bedtime.   Nutritional Supplements (JUICE PLUS FIBRE PO) Take by mouth.   Progesterone Micronized (PROGESTERONE PO) Take 150 mg by mouth daily.   No facility-administered encounter medications on file as of 07/10/2023.    Past Medical History:  Diagnosis Date   Contact lens/glasses fitting    wears contacts or glasses   Hypothyroidism    PONV (postoperative nausea and vomiting)    Thyroid condition     Past Surgical History:  Procedure Laterality Date   ABDOMINAL HYSTERECTOMY     CESAREAN SECTION     x2   CHOLECYSTECTOMY     I & D EXTREMITY Right 03/07/2013   Procedure: IRRIGATION AND DEBRIDEMENT RIGHT LONG FINGER;  Surgeon: Tami Ribas, MD;  Location: [facility];  Service: Orthopedics;  Laterality: Right;   TONSILLECTOMY     TUBAL LIGATION     ULNAR COLLATERAL LIGAMENT REPAIR Left 02/14/2023   Procedure: LEFT THUMB ULNAR COLLATERAL LIGAMENT REPAIR;  Surgeon: Betha Loa, MD;  Location: [facility];  Service: Orthopedics;  Laterality: Left;  60 MIN   WRIST ARTHROSCOPY  2011x2   tendon repair -left   WRIST ARTHROSCOPY     rt    Family History  Problem Relation Age of  Onset   Hypertension Mother    Hypertension Father    Breast cancer Neg Hx     Social History   Socioeconomic History   Marital status: Married    Spouse name: Not on file   Number of children: 2   Years of education: Not on file   Highest education level: High school graduate  Occupational History   Not on file  Tobacco Use   Smoking status: Never   Smokeless tobacco: Never  Vaping Use   Vaping status: Never Used  Substance and Sexual Activity   Alcohol use: Yes    Comment: "a glass of wine every now and then"   Drug use: No   Sexual activity: Yes    Birth control/protection: Surgical  Other Topics Concern   Not on file  Social History  Narrative   Not on file   Social Determinants of Health   Financial Resource Strain: Not on file  Food Insecurity: No Food Insecurity (03/09/2023)   Hunger Vital Sign    Worried About Running Out of Food in the Last Year: Never true    Ran Out of Food in the Last Year: Never true  Transportation Needs: No Transportation Needs (03/09/2023)   PRAPARE - Administrator, Civil Service (Medical): No    Lack of Transportation (Non-Medical): No  Physical Activity: Not on file  Stress: Not on file  Social Connections: Not on file  Intimate Partner Violence: Not on file    ROS      Objective    BP (!) 144/78   Pulse 83   Ht 5\' 6"  (1.676 m)   Wt 130 lb 1.9 oz (59 kg)   SpO2 98%   BMI 21.00 kg/m   Physical Exam Gen: alert, oriented Heent: perrla, eomi, mmm Cv: rrr, no murmur Pulm: lctab Gi: soft, nontender.  Msk: strength equal b/l Neuro: cn 2-12 grossly intact      Assessment & Plan:   Need for Tdap vaccination -     Tdap vaccine greater than or equal to 7yo IM  Hypertension, unspecified type Assessment & Plan: At normal weight.  Exercises.  Eats healthy.  Non smoker. Family hx of htn.  Pt wants further workup prior to starting medication - f/u bmp and RAA - RAS u/s ordered.   Orders: -     Basic metabolic panel; Future -     Aldosterone + renin activity w/ ratio; Future -     US RENAL ARTERY DUPLEX COMPLETE; Future  Hypothyroidism due to Hashimoto's thyroiditis Assessment & Plan: F/u TSH and t4.  Managed by NP in [place].   Orders: -     TSH + free T4; Future  Hyperlipidemia, unspecified hyperlipidemia type Assessment & Plan: F/u lipid panel.    Orders: -     Lipid panel; Future    Return in about 4 weeks (around 08/07/2023) for HTN.   Sandre Kitty, MD

## 2023-07-20 ENCOUNTER — Other Ambulatory Visit: Payer: Medicaid Other

## 2023-07-20 DIAGNOSIS — E038 Other specified hypothyroidism: Secondary | ICD-10-CM

## 2023-07-20 DIAGNOSIS — I10 Essential (primary) hypertension: Secondary | ICD-10-CM | POA: Diagnosis not present

## 2023-07-20 DIAGNOSIS — E063 Autoimmune thyroiditis: Secondary | ICD-10-CM | POA: Diagnosis not present

## 2023-07-20 DIAGNOSIS — Z419 Encounter for procedure for purposes other than remedying health state, unspecified: Secondary | ICD-10-CM | POA: Diagnosis not present

## 2023-07-20 DIAGNOSIS — E785 Hyperlipidemia, unspecified: Secondary | ICD-10-CM | POA: Diagnosis not present

## 2023-07-25 ENCOUNTER — Ambulatory Visit
Admission: RE | Admit: 2023-07-25 | Discharge: 2023-07-25 | Disposition: A | Discharge status: Home / Self Care | Payer: Medicaid Other | Source: Ambulatory Visit | Attending: Family Medicine | Admitting: Family Medicine

## 2023-07-25 DIAGNOSIS — I10 Essential (primary) hypertension: Secondary | ICD-10-CM | POA: Diagnosis not present

## 2023-07-31 ENCOUNTER — Telehealth: Payer: Self-pay | Admitting: *Deleted

## 2023-07-31 NOTE — Telephone Encounter (Signed)
Pt returned call and gave her the result message for her labs.

## 2023-08-14 ENCOUNTER — Ambulatory Visit: Payer: Self-pay | Admitting: Family Medicine

## 2023-08-15 ENCOUNTER — Ambulatory Visit (INDEPENDENT_AMBULATORY_CARE_PROVIDER_SITE_OTHER): Payer: Medicare Other | Admitting: Family Medicine

## 2023-08-15 ENCOUNTER — Encounter: Payer: Self-pay | Admitting: Family Medicine

## 2023-08-15 VITALS — BP 157/74 | Ht 66.0 in | Wt 131.0 lb

## 2023-08-15 DIAGNOSIS — I10 Essential (primary) hypertension: Secondary | ICD-10-CM

## 2023-08-15 DIAGNOSIS — E782 Mixed hyperlipidemia: Secondary | ICD-10-CM | POA: Diagnosis not present

## 2023-08-15 MED ORDER — AMLODIPINE BESYLATE 5 MG PO TABS: 5.0000 mg | ORAL_TABLET | Freq: Every day | ORAL | 3 refills | Status: DC

## 2023-08-15 NOTE — Assessment & Plan Note (Signed)
Had a long discussion about patient's blood pressure and reasons to take blood pressure medication including cardiovascular risk in kidney disease.  Patient agreed to take amlodipine.  Will follow-up in 1 month.  Advised patient to have repeat BMP sometime in the next 2 weeks.

## 2023-08-15 NOTE — Patient Instructions (Signed)
It was nice to see you today,  We addressed the following topics today: --I have started amlodipine 5mg .  You can cut this in half for the first week if you are concerned about side effects.  Take it once a day.   -I would like you to schedule a lab visit for either next week or the week after so we can recheck your kidney function.  Have a great day,  Frederic Jericho, MD

## 2023-08-15 NOTE — Progress Notes (Unsigned)
Established Patient Office Visit  Subjective   Patient ID: [name], female    DOB: December 23, 1960  Age: 62 y.o. MRN: [id number]  Chief Complaint  Patient presents with   Medical Management of Chronic Issues    HPI  HTN -we discussed the patient's normal renal ultrasound, normal lab work other than slight increase in her creatinine.  Patient wants to know if there are alternative workup options to see if there is anything to be done about her blood pressure that does not involve taking blood pressure medications.  Part of her concern stems from her husband being placed on blood pressure medications and subsequently having 2 syncopal episodes while working out.  She is concerned about this happening to her.  We discussed amlodipine.  Patient okay with starting this medication  Hyperlipidemia-we discussed patient's cholesterol levels.  Discussed her overall ASCVD risk score being less than 7.5%.  Discussed the relationship between coronary artery disease and peripheral artery disease and how it relates to blood pressure.  Discussed dietary and lifestyle modifications that can help improve cholesterol and blood pressure.  Patient is already very active and at a healthy weight.  Hypothyroid-patient no longer taking Armour Thyroid.  Is taking the other forms of thyroid listed on her medication list.   The 10-year ASCVD risk score (Arnett DK, et al., 2019) is: 7.1%  Health Maintenance Due  Topic Date Due   HIV Screening  Never done   Hepatitis C Screening  Never done   PAP SMEAR-Modifier  Never done   Colonoscopy  Never done   Zoster Vaccines- Shingrix (1 of 2) Never done   COVID-19 Vaccine (1 - 2023-24 season) Never done   INFLUENZA VACCINE  07/20/2023      Objective:     BP (!) 157/74   Ht 5\' 6"  (1.676 m)   Wt 131 lb (59.4 kg)   SpO2 99%   BMI 21.14 kg/m  {Vitals History (Optional):23777}  Physical Exam General: Alert, oriented Pulmonary: No respiratory stress Psych:  Pleasant affect, spontaneous speech    No results found for any visits on 08/15/23.      Assessment & Plan:   Primary hypertension Assessment & Plan: Had a long discussion about patient's blood pressure and reasons to take blood pressure medication including cardiovascular risk in kidney disease.  Patient agreed to take amlodipine.  Will follow-up in 1 month.  Advised patient to have repeat BMP sometime in the next 2 weeks.   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  Overall ASCVD risk score is 6%.  Started on blood pressure medication today.  Will continue to monitor cholesterol periodically.  Recheck approximately next 3 to 6 months.   Other orders -     amLODIPine Besylate; Take 1 tablet (5 mg total) by mouth daily.  Dispense: 90 tablet; Refill: 3     Return in about 4 weeks (around 09/12/2023) for HTN.    Sandre Kitty, MD

## 2023-08-15 NOTE — Assessment & Plan Note (Signed)
Patient's LDL and total cholesterol were elevated.  Overall ASCVD risk score is 6%.  Started on blood pressure medication today.  Will continue to monitor cholesterol periodically.  Recheck approximately next 3 to 6 months.

## 2023-08-16 ENCOUNTER — Emergency Department (HOSPITAL_BASED_OUTPATIENT_CLINIC_OR_DEPARTMENT_OTHER): Payer: Medicare Other

## 2023-08-16 ENCOUNTER — Encounter (HOSPITAL_BASED_OUTPATIENT_CLINIC_OR_DEPARTMENT_OTHER): Payer: Self-pay | Admitting: Emergency Medicine

## 2023-08-16 ENCOUNTER — Emergency Department (HOSPITAL_BASED_OUTPATIENT_CLINIC_OR_DEPARTMENT_OTHER)
Admission: EM | Admit: 2023-08-16 | Discharge: 2023-08-16 | Disposition: A | Discharge status: Home / Self Care | Payer: Medicare Other | Attending: Emergency Medicine | Admitting: Emergency Medicine

## 2023-08-16 ENCOUNTER — Other Ambulatory Visit: Payer: Self-pay

## 2023-08-16 DIAGNOSIS — I10 Essential (primary) hypertension: Secondary | ICD-10-CM | POA: Insufficient documentation

## 2023-08-16 DIAGNOSIS — R1030 Lower abdominal pain, unspecified: Secondary | ICD-10-CM

## 2023-08-16 DIAGNOSIS — E039 Hypothyroidism, unspecified: Secondary | ICD-10-CM | POA: Diagnosis not present

## 2023-08-16 DIAGNOSIS — K5730 Diverticulosis of large intestine without perforation or abscess without bleeding: Secondary | ICD-10-CM | POA: Diagnosis not present

## 2023-08-16 DIAGNOSIS — Z79899 Other long term (current) drug therapy: Secondary | ICD-10-CM | POA: Diagnosis not present

## 2023-08-16 DIAGNOSIS — R1031 Right lower quadrant pain: Secondary | ICD-10-CM | POA: Diagnosis not present

## 2023-08-16 DIAGNOSIS — R1032 Left lower quadrant pain: Secondary | ICD-10-CM | POA: Insufficient documentation

## 2023-08-16 DIAGNOSIS — N200 Calculus of kidney: Secondary | ICD-10-CM | POA: Diagnosis not present

## 2023-08-16 DIAGNOSIS — Z7982 Long term (current) use of aspirin: Secondary | ICD-10-CM | POA: Diagnosis not present

## 2023-08-16 HISTORY — DX: Essential (primary) hypertension: I10

## 2023-08-16 LAB — CBC
HCT: 41.2 % (ref 36.0–46.0)
Hemoglobin: 13.1 g/dL (ref 12.0–15.0)
MCH: 26.2 pg (ref 26.0–34.0)
MCHC: 31.8 g/dL (ref 30.0–36.0)
MCV: 82.4 fL (ref 80.0–100.0)
Platelets: 243 10*3/uL (ref 150–400)
RBC: 5 MIL/uL (ref 3.87–5.11)
RDW: 13.2 % (ref 11.5–15.5)
WBC: 9.8 10*3/uL (ref 4.0–10.5)
nRBC: 0 % (ref 0.0–0.2)

## 2023-08-16 LAB — COMPREHENSIVE METABOLIC PANEL
ALT: 12 U/L (ref 0–44)
AST: 15 U/L (ref 15–41)
Albumin: 4.3 g/dL (ref 3.5–5.0)
Alkaline Phosphatase: 50 U/L (ref 38–126)
Anion gap: 8 (ref 5–15)
BUN: 18 mg/dL (ref 6–20)
CO2: 25 mmol/L (ref 22–32)
Calcium: 9.2 mg/dL (ref 8.9–10.3)
Chloride: 104 mmol/L (ref 98–111)
Creatinine, Ser: 0.98 mg/dL (ref 0.44–1.00)
GFR, Estimated: >60 mL/min (ref >60)
Glucose, Bld: 84 mg/dL (ref 70–99)
Potassium: 4.1 mmol/L (ref 3.5–5.1)
Sodium: 137 mmol/L (ref 135–145)
Total Bilirubin: 0.3 mg/dL (ref 0.3–1.2)
Total Protein: 7.2 g/dL (ref 6.5–8.1)

## 2023-08-16 LAB — URINALYSIS, ROUTINE W REFLEX MICROSCOPIC
Bilirubin Urine: NEGATIVE
Glucose, UA: NEGATIVE mg/dL
Hgb urine dipstick: NEGATIVE
Ketones, ur: NEGATIVE mg/dL
Leukocytes,Ua: NEGATIVE
Nitrite: NEGATIVE
Protein, ur: NEGATIVE mg/dL
Specific Gravity, Urine: 1.019 (ref 1.005–1.030)
pH: 6 (ref 5.0–8.0)

## 2023-08-16 LAB — LIPASE, BLOOD: Lipase: 42 U/L (ref 11–51)

## 2023-08-16 NOTE — Discharge Instructions (Addendum)
Your workup including labs, urine sample and imaging was reassuring today.  This could likely be muscular pain. Please take your medications as prescribed. Take tylenol/ibuprofen/naproxen for pain. I recommend close follow-up with PCP for reevaluation.  Please do not hesitate to return to emergency department if worrisome signs symptoms we discussed become apparent.

## 2023-08-16 NOTE — ED Triage Notes (Signed)
Pt via pov from home with abdominal pain x 2 days; she reports that she pulled on something heavy and wonders if that could be the cause. She reports pressure on her rectum and bladder. She reports nausea, denies emesis. Pt endorses frequency, but states she drinks a lot so this is not unusua. Denies hemauturia. Pt alert & oriented, nad noted.

## 2023-08-16 NOTE — ED Provider Notes (Signed)
[place] EMERGENCY DEPARTMENT AT [facility] Provider Note   CSN: [id number] Arrival date & time: 08/16/23  1450     History {Add pertinent medical, surgical, social history, OB history to HPI:1} Chief Complaint  Patient presents with  . Abdominal Pain    [name] is a 62 y.o. female history of hypertension, hypothyroidism presented today for evaluation of abdominal pain.  Patient states that she has had low abdominal pain for 2 days.  Pain is in her lower abdomen, constant, nonradiating.  Patient said that 2 days ago after riding her horse, she pulled on a heavy rake and started to have severe lower abdominal pain.  Patient has tried some ibuprofen at home with some relief.  She denies any fever, nausea, vomiting, urinary symptoms, blood in her stool or urine, bowel changes.   Abdominal Pain   Past Medical History:  Diagnosis Date  . Contact lens/glasses fitting    wears contacts or glasses  . Hypertension   . Hypothyroidism   . PONV (postoperative nausea and vomiting)   . Thyroid condition    Past Surgical History:  Procedure Laterality Date  . ABDOMINAL HYSTERECTOMY    . CESAREAN SECTION     x2  . CHOLECYSTECTOMY    . I & D EXTREMITY Right 03/07/2013   Procedure: IRRIGATION AND DEBRIDEMENT RIGHT LONG FINGER;  Surgeon: Tami Ribas, MD;  Location: [facility];  Service: Orthopedics;  Laterality: Right;  . TONSILLECTOMY    . TUBAL LIGATION    . ULNAR COLLATERAL LIGAMENT REPAIR Left 02/14/2023   Procedure: LEFT THUMB ULNAR COLLATERAL LIGAMENT REPAIR;  Surgeon: Betha Loa, MD;  Location: [facility];  Service: Orthopedics;  Laterality: Left;  60 MIN  . WRIST ARTHROSCOPY  2011x2   tendon repair -left  . WRIST ARTHROSCOPY     rt     Home Medications Prior to Admission medications   Medication Sig Start Date End Date Taking? Authorizing Provider  acetaminophen-codeine (TYLENOL #3) 300-30 MG tablet Take 1 tablet by mouth  every 6 (six) hours as needed for moderate pain. 02/14/23   Betha Loa, MD  amLODipine (NORVASC) 5 MG tablet Take 1 tablet (5 mg total) by mouth daily. 08/15/23   Sandre Kitty, MD  aspirin EC 81 MG tablet Take 81 mg by mouth daily. Swallow whole.    [provider]  Estradiol-Estriol-Progesterone (BIEST/PROGESTERONE) CREA Place 1 application onto the skin daily.    [provider]  hydrochlorothiazide (HYDRODIURIL) 12.5 MG tablet Take by mouth. 05/04/23   [provider]  levothyroxine (SYNTHROID) 50 MCG tablet Take 1 tablet by mouth daily before breakfast. 05/05/23   [provider]  liothyronine (CYTOMEL) 5 MCG tablet Take 10 mcg by mouth daily. 05/05/23   [provider]  naltrexone (DEPADE) 50 MG tablet Take 50 mg by mouth at bedtime.    [provider]  Nutritional Supplements (JUICE PLUS FIBRE PO) Take by mouth.    [provider]  Progesterone Micronized (PROGESTERONE PO) Take 150 mg by mouth daily.    [provider]      Allergies    Ancef [cefazolin], Codeine, Hydrocodone, Iohexol, Percocet [oxycodone-acetaminophen], and Prednisone    Review of Systems   Review of Systems  Gastrointestinal:  Positive for abdominal pain.    Physical Exam Updated Vital Signs BP (!) 154/83   Pulse 70   Temp 98.5 F (36.9 C)   Resp 16   Ht 5\' 6"  (1.676  m)   Wt 59 kg   SpO2 99%   BMI 20.98 kg/m  Physical Exam Vitals and nursing note reviewed.  Constitutional:      Appearance: Normal appearance.  HENT:     Head: Normocephalic and atraumatic.     Mouth/Throat:     Mouth: Mucous membranes are moist.  Eyes:     General: No scleral icterus. Cardiovascular:     Rate and Rhythm: Normal rate and regular rhythm.     Pulses: Normal pulses.     Heart sounds: Normal heart sounds.  Pulmonary:     Effort: Pulmonary effort is normal.     Breath sounds: Normal breath sounds.  Abdominal:     General: Abdomen is flat.      Palpations: Abdomen is soft.     Tenderness: There is abdominal tenderness in the right lower quadrant, suprapubic area and left lower quadrant.  Musculoskeletal:        General: No deformity.  Skin:    General: Skin is warm.     Findings: No rash.  Neurological:     General: No focal deficit present.     Mental Status: She is alert.  Psychiatric:        Mood and Affect: Mood normal.    ED Results / Procedures / Treatments   Labs (all labs ordered are listed, but only abnormal results are displayed) Labs Reviewed  LIPASE, BLOOD  COMPREHENSIVE METABOLIC PANEL  CBC  URINALYSIS, ROUTINE W REFLEX MICROSCOPIC    EKG None  Radiology No results found.  Procedures Procedures  {Document cardiac monitor, telemetry assessment procedure when appropriate:1}  Medications Ordered in ED Medications - No data to display  ED Course/ Medical Decision Making/ A&P   {   Click here for ABCD2, HEART and other calculatorsREFRESH Note before signing :1}                              Medical Decision Making Amount and/or Complexity of Data Reviewed Labs: ordered. Radiology: ordered.   This patient presents to the ED for ***, this involves an extensive number of treatment options, and is a complaint that carries with a high risk of complications and morbidity.  The differential diagnosis includes ***.  This is not an exhaustive list.  Lab tests: I ordered and personally interpreted labs.  The pertinent results include: WBC unremarkable. Hbg unremarkable. Platelets unremarkable. Electrolytes unremarkable. BUN, creatinine unremarkable. ***  Imaging studies: I ordered imaging studies, personally reviewed, interpreted imaging and agree with the radiologist's interpretations. The results include: ***   Problem list/ ED course/ Critical interventions/ Medical management: HPI: See above Vital signs ***within normal range and stable throughout visit. Laboratory/imaging studies significant  for: See above. On physical examination, patient is afebrile and appears in no acute distress. Patient presents with lower abdominal pain/pelvic pain. Abdominal exam without peritoneal signs. No evidence of acute abdomen at this time. Well appearing. Patient with pelvic done with no CMT, adnexal tenderness, or vaginal discharge concerning for PID or TOA. Considered and doubt ovarian torsion given history and presentation. Given work up low suspicion for acute hepatobiliary disease (including acute cholecystitis), acute pancreatitis (neg lipase), PUD and gastric perforation, acute infectious processes (pneumonia, hepatitis, pyelonephritis), acute appendicitis, vascular catastrophe, bowel obstruction or viscus perforation, diverticulitis. Presentation not consistent with other acute, emergent causes of abdominal pain at this time.  I have reviewed the patient home medicines and have made adjustments as  needed.  Cardiac monitoring/EKG: The patient was maintained on a cardiac monitor.  I personally reviewed and interpreted the cardiac monitor which showed an underlying rhythm of: sinus rhythm.  Additional history obtained: External records from outside source obtained and reviewed including: Chart review including previous notes, labs, imaging.  Consultations obtained:  Disposition Continued outpatient therapy. Follow-up with PCP recommended for reevaluation of symptoms. Treatment plan discussed with patient.  Pt acknowledged understanding was agreeable to the plan. Worrisome signs and symptoms were discussed with patient, and patient acknowledged understanding to return to the ED if they noticed these signs and symptoms. Patient was stable upon discharge.   This chart was dictated using voice recognition software.  Despite best efforts to proofread,  errors can occur which can change the documentation meaning.    {Document critical care time when appropriate:1} {Document review of labs and clinical  decision tools ie heart score, Chads2Vasc2 etc:1}  {Document your independent review of radiology images, and any outside records:1} {Document your discussion with family members, caretakers, and with consultants:1} {Document social determinants of health affecting pt's care:1} {Document your decision making why or why not admission, treatments were needed:1} Final Clinical Impression(s) / ED Diagnoses Final diagnoses:  None    Rx / DC Orders ED Discharge Orders     None

## 2023-08-16 NOTE — ED Notes (Signed)
Patient verbalizes understanding of discharge instructions. Opportunity for questioning and answers were provided. Armband removed by staff, pt discharged from ED. Ambulated out to lobby  

## 2023-08-20 DIAGNOSIS — Z419 Encounter for procedure for purposes other than remedying health state, unspecified: Secondary | ICD-10-CM | POA: Diagnosis not present

## 2023-08-23 ENCOUNTER — Other Ambulatory Visit: Payer: Self-pay

## 2023-08-23 DIAGNOSIS — I10 Essential (primary) hypertension: Secondary | ICD-10-CM

## 2023-08-29 ENCOUNTER — Other Ambulatory Visit: Payer: Medicaid Other

## 2023-09-11 ENCOUNTER — Other Ambulatory Visit: Payer: Medicaid Other

## 2023-09-12 ENCOUNTER — Ambulatory Visit: Payer: Medicaid Other | Admitting: Family Medicine

## 2023-09-19 ENCOUNTER — Other Ambulatory Visit: Payer: Self-pay | Admitting: Family Medicine

## 2023-09-19 DIAGNOSIS — Z419 Encounter for procedure for purposes other than remedying health state, unspecified: Secondary | ICD-10-CM | POA: Diagnosis not present

## 2023-09-19 DIAGNOSIS — I10 Essential (primary) hypertension: Secondary | ICD-10-CM | POA: Diagnosis not present

## 2023-09-20 LAB — BASIC METABOLIC PANEL
BUN/Creatinine Ratio: 15 (ref 12–28)
BUN: 16 mg/dL (ref 8–27)
CO2: 24 mmol/L (ref 20–29)
Calcium: 9.3 mg/dL (ref 8.7–10.3)
Chloride: 101 mmol/L (ref 96–106)
Creatinine, Ser: 1.1 mg/dL — ABNORMAL HIGH (ref 0.57–1.00)
Glucose: 90 mg/dL (ref 70–99)
Potassium: 4.4 mmol/L (ref 3.5–5.2)
Sodium: 137 mmol/L (ref 134–144)
eGFR: 58 mL/min/{1.73_m2} — ABNORMAL LOW (ref >59)

## 2023-09-26 ENCOUNTER — Ambulatory Visit (INDEPENDENT_AMBULATORY_CARE_PROVIDER_SITE_OTHER): Payer: Medicare Other | Admitting: Family Medicine

## 2023-09-26 ENCOUNTER — Encounter: Payer: Self-pay | Admitting: Family Medicine

## 2023-09-26 VITALS — BP 132/74 | HR 78 | Ht 66.0 in | Wt 133.8 lb

## 2023-09-26 DIAGNOSIS — I10 Essential (primary) hypertension: Secondary | ICD-10-CM

## 2023-09-26 DIAGNOSIS — N182 Chronic kidney disease, stage 2 (mild): Secondary | ICD-10-CM

## 2023-09-26 DIAGNOSIS — M26609 Unspecified temporomandibular joint disorder, unspecified side: Secondary | ICD-10-CM | POA: Diagnosis not present

## 2023-09-26 HISTORY — DX: Unspecified temporomandibular joint disorder, unspecified side: M26.609

## 2023-09-26 HISTORY — DX: Chronic kidney disease, stage 2 (mild): N18.2

## 2023-09-26 NOTE — Assessment & Plan Note (Signed)
GFR hovering around 60.  Prior to last month there were no creatinine values for 9 years to compare to.  Appears stable.  Patient taking electrolyte powders with her water.  Discussed how hydration is important as well as controlling blood pressure.  Would favor uncontrolled high blood pressure as the cause at this time.  Will continue to monitor kidney function.  Recheck at next visit 3 months.

## 2023-09-26 NOTE — Assessment & Plan Note (Signed)
Left-sided, going on a few weeks now.  Discussed conservative management with topical heat, NSAIDs, Tylenol alternating.  Discussed treatment options if these measures fail.  Patient to see her dentist tomorrow.  Advised her to keep appointment with dentist so they can evaluate if she would be appropriate for nocturnal mouthguard if nocturnal clenching/grinding is contributing to her symptoms.

## 2023-09-26 NOTE — Assessment & Plan Note (Signed)
Tolerating amlodipine.  Advised patient to switch from taking at night to during the day to see if this helps afternoon blood pressure readings.  Follow-up 3 months.

## 2023-09-26 NOTE — Progress Notes (Unsigned)
Established Patient Office Visit  Subjective   Patient ID: [name], female    DOB: December 06, 1961  Age: 62 y.o. MRN: [id number]  Chief Complaint  Patient presents with   Medical Management of Chronic Issues    HPI  Hypertension-patient has been taking her amlodipine at night.  She reports that side effects if she took it during the day.  Has not had any issues with it since taking it.  Notices that her blood pressure "creeps up" during the afternoon.  We discussed taking it in the morning so that the concentration can peak during the day when blood pressure is highest.  Patient complaining of left-sided jaw pain near her ear that she first notices when she started taking the medication.  We discussed how this is not a known side effect of amlodipine I am aware of.  Discussed TMJ joint dysfunction and different causes of it including clenching and grinding and excessive use.  She has been taking ibuprofen as needed for the pain.  She is planning on seeing her dentist tomorrow.  Colonoscopy-patient unsure when she had her last colonoscopy but thinks she is due soon.  We discussed previous documentation showing 2013 or 2015 as the date of her last colonoscopy.  Will be due for colonoscopy next year at the latest.  The 10-year ASCVD risk score (Arnett DK, et al., 2019) is: 5%  Health Maintenance Due  Topic Date Due   Medicare Annual Wellness (AWV)  Never done   HIV Screening  Never done   Hepatitis C Screening  Never done   Cervical Cancer Screening (HPV/Pap Cotest)  Never done   Colonoscopy  Never done   Zoster Vaccines- Shingrix (1 of 2) Never done   INFLUENZA VACCINE  Never done   COVID-19 Vaccine (1 - 2023-24 season) Never done      Objective:     BP 132/74   Pulse 78   Ht 5\' 6"  (1.676 m)   Wt 133 lb 12.8 oz (60.7 kg)   SpO2 98%   BMI 21.60 kg/m  {Vitals History (Optional):23777}  Physical Exam General: Alert, oriented HEENT: Tenderness of the left TMJ joint and  the preauricular area.  No horizontal drift with opening and closing of the jaw. Extremities: No pedal edema.   No results found for any visits on 09/26/23.      Assessment & Plan:   Primary hypertension Assessment & Plan: Tolerating amlodipine.  Advised patient to switch from taking at night to during the day to see if this helps afternoon blood pressure readings.  Follow-up 3 months.   TMJ dysfunction Assessment & Plan: Left-sided, going on a few weeks now.  Discussed conservative management with topical heat, NSAIDs, Tylenol alternating.  Discussed treatment options if these measures fail.  Patient to see her dentist tomorrow.  Advised her to keep appointment with dentist so they can evaluate if she would be appropriate for nocturnal mouthguard if nocturnal clenching/grinding is contributing to her symptoms.   Stage 2 chronic kidney disease Assessment & Plan: GFR hovering around 60.  Prior to last month there were no creatinine values for 9 years to compare to.  Appears stable.  Patient taking electrolyte powders with her water.  Discussed how hydration is important as well as controlling blood pressure.  Would favor uncontrolled high blood pressure as the cause at this time.  Will continue to monitor kidney function.  Recheck at next visit 3 months.      Return in about  3 months (around 12/27/2023) for HTN.    Sandre Kitty, MD

## 2023-09-26 NOTE — Patient Instructions (Addendum)
It was nice to see you today,  We addressed the following topics today: Try taking your amlodipine during the day.  This way the medicine will peak during the day when your blood pressure is at its highest. - It looks like you would be due for a colonoscopy next year.  We can continue to monitor this. - The pain in your left jaw seems consistent with temporomandibular joint dysfunction.  This can be caused by excessive wear on the jaw or nocturnal grinding.  You can take NSAIDs like ibuprofen.  You can also alternate this with Tylenol.  I would not take NSAIDs like ibuprofen more than 2 weeks at a time if you are taking them every day.  Have a great day,  Frederic Jericho, MD

## 2023-10-20 DIAGNOSIS — Z419 Encounter for procedure for purposes other than remedying health state, unspecified: Secondary | ICD-10-CM | POA: Diagnosis not present

## 2023-11-19 DIAGNOSIS — Z419 Encounter for procedure for purposes other than remedying health state, unspecified: Secondary | ICD-10-CM | POA: Diagnosis not present

## 2023-12-04 ENCOUNTER — Telehealth: Payer: Self-pay | Admitting: Family Medicine

## 2023-12-04 NOTE — Telephone Encounter (Signed)
Patient was put on a new bp but she had a really bad reaction she wanted to see if dr  Constance Goltz can refer her to cardiologist         dr Lalla Brothers at [facility] number is [phone] and she would like a call back regarding this her feet her swollen and she has a family history of heart problems

## 2023-12-06 NOTE — Telephone Encounter (Signed)
Tried to call the patient back but went to voicemail.  Left message informing her we will try again later or she could try calling her office tomorrow.  If she calls back let her know that leg swelling can be a side effect of amlodipine and gets better after you stop taking it.  Also find out if she wants to specifically see Dr. Lalla Brothers, if it is for blood pressure only or another issue, and if she is still taking the amlodipine.

## 2023-12-07 NOTE — Telephone Encounter (Signed)
Called pt she stated that she stop taking the Amlodipine a long time ago she said any doctor is ok to see. She would also like to see a cardiologist she has a family history of heart disease.

## 2023-12-08 ENCOUNTER — Other Ambulatory Visit: Payer: Self-pay | Admitting: Family Medicine

## 2023-12-08 DIAGNOSIS — I10 Essential (primary) hypertension: Secondary | ICD-10-CM

## 2023-12-08 NOTE — Telephone Encounter (Signed)
Please let pt know cardiology referral was placed

## 2023-12-11 NOTE — Telephone Encounter (Signed)
Called patient LVM advising her that the referral was placed

## 2023-12-20 DIAGNOSIS — Z419 Encounter for procedure for purposes other than remedying health state, unspecified: Secondary | ICD-10-CM | POA: Diagnosis not present

## 2023-12-27 ENCOUNTER — Ambulatory Visit: Payer: Medicare Other | Admitting: Family Medicine

## 2023-12-28 ENCOUNTER — Telehealth: Payer: Self-pay | Admitting: *Deleted

## 2023-12-28 ENCOUNTER — Other Ambulatory Visit: Payer: Self-pay

## 2023-12-28 ENCOUNTER — Ambulatory Visit: Payer: Medicaid Other | Attending: Cardiology | Admitting: Cardiology

## 2023-12-28 VITALS — BP 148/80 | HR 71 | Ht 66.0 in | Wt 132.0 lb

## 2023-12-28 DIAGNOSIS — E782 Mixed hyperlipidemia: Secondary | ICD-10-CM | POA: Diagnosis not present

## 2023-12-28 DIAGNOSIS — I700 Atherosclerosis of aorta: Secondary | ICD-10-CM | POA: Insufficient documentation

## 2023-12-28 DIAGNOSIS — I10 Essential (primary) hypertension: Secondary | ICD-10-CM | POA: Diagnosis not present

## 2023-12-28 DIAGNOSIS — R112 Nausea with vomiting, unspecified: Secondary | ICD-10-CM | POA: Insufficient documentation

## 2023-12-28 DIAGNOSIS — R079 Chest pain, unspecified: Secondary | ICD-10-CM | POA: Diagnosis not present

## 2023-12-28 DIAGNOSIS — E079 Disorder of thyroid, unspecified: Secondary | ICD-10-CM | POA: Insufficient documentation

## 2023-12-28 DIAGNOSIS — Z460 Encounter for fitting and adjustment of spectacles and contact lenses: Secondary | ICD-10-CM | POA: Insufficient documentation

## 2023-12-28 DIAGNOSIS — E039 Hypothyroidism, unspecified: Secondary | ICD-10-CM | POA: Insufficient documentation

## 2023-12-28 MED ORDER — LOSARTAN POTASSIUM 50 MG PO TABS
50.0000 mg | ORAL_TABLET | Freq: Every day | ORAL | 3 refills | Status: DC
Start: 2023-12-28 — End: 2024-02-23

## 2023-12-28 NOTE — Telephone Encounter (Signed)
 Gave pt instructions for stress test.

## 2023-12-28 NOTE — Progress Notes (Signed)
 Cardiology Office Note:    Date:  12/28/2023   ID:  [name], DOB [birth year], MRN [id number]  PCP:  Chandra Toribio POUR, MD  Cardiologist:  Jennifer JONELLE Crape, MD   Referring MD: Chandra Toribio POUR, MD    ASSESSMENT:    1. Primary hypertension   2. Mixed hyperlipidemia   3. Atherosclerosis of aorta (HCC)   4. Chest pain of uncertain etiology    PLAN:    In order of problems listed above:  Primary prevention stressed with the patient.  Importance of compliance with diet medication stressed and patient verbalized standing. Aortic atherosclerosis: I discussed my findings with her at length.  This is from a CT scan done in the past. Chest pain: Atypical in nature but in view of risk factors we will do exercise stress echo.  She is agreeable.  She wants to start an exercise protocol and I told her to do this in a graded fashion after we get back to her about the results of the stress test. Mixed dyslipidemia: Markedly elevated lipids.  I will start her on statin medications after I do baseline blood work.  Goal LDL less than 60. Essential hypertension: Compliance with medications or diet emphasized.  Salt intake issues discussed.  Will start losartan  50 mg daily.  She will be back in 1 week for pulse blood pressure check and Chem-7.  This can be a nurse visit. Patient will be seen in follow-up appointment in 6 months or earlier if the patient has any concerns.    Medication Adjustments/Labs and Tests Ordered: Current medicines are reviewed at length with the patient today.  Concerns regarding medicines are outlined above.  Orders Placed This Encounter  Procedures   EKG 12-Lead   No orders of the defined types were placed in this encounter.    History of Present Illness:    [name] is a 63 y.o. female who is being seen today for the evaluation of chest pain at the request of Chandra Toribio POUR, MD. patient is a pleasant 63 year old female.  She has past medical history of  essential hypertension, mixed dyslipidemia and aortic atherosclerosis.  She mentions that she occasionally has chest tightness and this is not related to exertion.  No orthopnea or PND.  She is concerned about the symptoms and wants an evaluation.  She is not on blood pressure medication.  She has not tolerated amlodipine .  At the time of my evaluation, the patient is alert awake oriented and in no distress.  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    Past Surgical History:  Procedure Laterality Date   ABDOMINAL HYSTERECTOMY     CESAREAN SECTION     x2   CHOLECYSTECTOMY     I & D EXTREMITY Right 03/07/2013   Procedure: IRRIGATION AND DEBRIDEMENT RIGHT LONG FINGER;  Surgeon: Franky JONELLE Curia, MD;  Location: [facility];  Service: Orthopedics;  Laterality: Right;   TONSILLECTOMY  TUBAL LIGATION     ULNAR COLLATERAL LIGAMENT REPAIR Left 02/14/2023   Procedure: LEFT THUMB ULNAR COLLATERAL LIGAMENT REPAIR;  Surgeon: Murrell Drivers, MD;  Location: [facility];  Service: Orthopedics;  Laterality: Left;  60 MIN   WRIST ARTHROSCOPY  2011x2   tendon repair -left   WRIST ARTHROSCOPY     rt    Current Medications: Current Meds  Medication Sig   acetaminophen -codeine  (TYLENOL  #3) 300-30 MG tablet Take 1 tablet by mouth every 6 (six) hours as needed for moderate pain.   aspirin EC 81 MG tablet Take 81 mg by mouth daily. Swallow whole.   EC-RX Testosterone 0.4 % CREA Place 1 Application onto the skin at bedtime.    Estradiol-Estriol-Progesterone (BIEST/PROGESTERONE) CREA Place 1 application onto the skin daily.   finasteride (PROSCAR) 5 MG tablet Take 2.5 mg by mouth daily.   levothyroxine (SYNTHROID) 100 MCG tablet Take 100 mcg by mouth daily before breakfast.   liothyronine (CYTOMEL) 5 MCG tablet Take 15 mcg by mouth daily.   NALTREXONE HCL PO Take 7.5 mg by mouth at bedtime.   Nutritional Supplements (JUICE PLUS FIBRE PO) Take 1 each by mouth daily.   Progesterone Micronized (PROGESTERONE PO) Take 200 mg by mouth at bedtime.     Allergies:   Ancef  [cefazolin ], Codeine , Amlodipine , Hydrocodone, Iohexol, Percocet [oxycodone-acetaminophen ], and Prednisone   Social History   Socioeconomic History   Marital status: Married    Spouse name: Not on file   Number of children: 2   Years of education: Not on file   Highest education level: High school graduate  Occupational History   Not on file  Tobacco Use   Smoking status: Never   Smokeless tobacco: Never  Vaping Use   Vaping status: Never Used  Substance and Sexual Activity   Alcohol use: Yes    Comment: a glass of wine every now and then   Drug use: No   Sexual activity: Yes    Birth control/protection: Surgical  Other Topics Concern   Not on file  Social History Narrative   Not on file   Social Drivers of Health   Financial Resource Strain: Not on file  Food Insecurity: No Food Insecurity (03/09/2023)   Hunger Vital Sign    Worried About Running Out of Food in the Last Year: Never true    Ran Out of Food in the Last Year: Never true  Transportation Needs: No Transportation Needs (03/09/2023)   PRAPARE - Administrator, Civil Service (Medical): No    Lack of Transportation (Non-Medical): No  Physical Activity: Not on file  Stress: Not on file  Social Connections: Not on file     Family History: The patient's family history includes Hypertension in her father and mother. There is no history of Breast cancer.  ROS:    Please see the history of present illness.    All other systems reviewed and are negative.  EKGs/Labs/Other Studies Reviewed:    The following studies were reviewed today:  EKG Interpretation Date/Time:  Thursday December 28 2023 10:15:05 EST Ventricular Rate:  71 PR Interval:  134 QRS Duration:  78 QT Interval:  372 QTC Calculation: 404 R Axis:   24  Text Interpretation: Normal sinus rhythm Right atrial enlargement Minimal voltage criteria for LVH, may be normal variant ( Cornell product ) When compared with ECG of 31-May-2014 09:04, PREVIOUS ECG IS PRESENT Confirmed by Edwyna Backers [phone]) on 12/28/2023 10:17:08 AM  Recent Labs: 07/20/2023: TSH 3.290 08/16/2023: ALT 12; Hemoglobin 13.1; Platelets 243 09/19/2023: BUN 16; Creatinine, Ser 1.10; Potassium 4.4; Sodium 137  Recent Lipid Panel    Component Value Date/Time   CHOL 228 (H) 07/20/2023 0937   TRIG 92 07/20/2023 0937   HDL 58 07/20/2023 0937   CHOLHDL 3.9 07/20/2023 0937   LDLCALC 154 (H) 07/20/2023 0937    Physical Exam:    VS:  BP (!) 158/78   Pulse 71   Ht 5' 6 (1.676 m)   Wt 132 lb (59.9 kg)   SpO2 97%   BMI 21.31 kg/m     Wt Readings from Last 3 Encounters:  12/28/23 132 lb (59.9 kg)  09/26/23 133 lb 12.8 oz (60.7 kg)  08/16/23 130 lb (59 kg)     GEN: Patient is in no acute distress HEENT: Normal NECK: No JVD; No carotid bruits LYMPHATICS: No lymphadenopathy CARDIAC: S1 S2 regular, 2/6 systolic murmur at the apex. RESPIRATORY:  Clear to auscultation without rales, wheezing or rhonchi  ABDOMEN: Soft, non-tender, non-distended MUSCULOSKELETAL:  No edema; No deformity  SKIN: Warm and dry NEUROLOGIC:  Alert and oriented x 3 PSYCHIATRIC:  Normal affect    Signed, Jennifer JONELLE Crape, MD  12/28/2023 10:35 AM    [facility]

## 2023-12-28 NOTE — Patient Instructions (Signed)
 Medication Instructions:  Your physician has recommended you make the following change in your medication:   Start Losartan  50 mg daily  *If you need a refill on your cardiac medications before your next appointment, please call your pharmacy*   Lab Work: Your physician recommends that you return for lab work in: 1 week for CMP, TSH and lipids You need to have labs done when you are fasting.  You can come Monday through Friday 8:30 am to 12:00 pm and 1:15 to 4:30. You do not need to make an appointment as the order has already been placed.   If you have labs (blood work) drawn today and your tests are completely normal, you will receive your results only by: MyChart Message (if you have MyChart) OR A paper copy in the mail If you have any lab test that is abnormal or we need to change your treatment, we will call you to review the results.   Testing/Procedures: You are scheduled for a Myocardial Perfusion Imaging Study.  Please arrive 15 minutes prior to your appointment time for registration and insurance purposes.  The test will take approximately 3 to 4 hours to complete; you may bring reading material.  If someone comes with you to your appointment, they will need to remain in the main lobby due to limited space in the testing area.   How to prepare for your Myocardial Perfusion Test: Do not eat or drink after midnight prior to your test, except you may have water.  Do not consume products containing caffeine  (regular or decaffeinated) 12 hours prior to your test. (ex: coffee, chocolate, sodas, tea). Do bring a list of your current medications with you.  If not listed below, you may take your medications as normal. Do wear comfortable clothes (no dresses or overalls) and walking shoes, tennis shoes preferred (No heels or open toe shoes are allowed). Do NOT wear cologne, perfume, aftershave, or lotions (deodorant is allowed). If these instructions are not followed, your test will have  to be rescheduled.  If you cannot keep your appointment, please provide 24 hours notification to the Nuclear Lab, to avoid a possible $50 charge to your account.  Follow-Up: At [facility], you and your health needs are our priority.  As part of our continuing mission to provide you with exceptional heart care, we have created designated Provider Care Teams.  These Care Teams include your primary Cardiologist (physician) and Advanced Practice Providers (APPs -  Physician Assistants and Nurse Practitioners) who all work together to provide you with the care you need, when you need it.  We recommend signing up for the patient portal called MyChart.  Sign up information is provided on this After Visit Summary.  MyChart is used to connect with patients for Virtual Visits (Telemedicine).  Patients are able to view lab/test results, encounter notes, upcoming appointments, etc.  Non-urgent messages can be sent to your provider as well.   To learn more about what you can do with MyChart, go to [email or web address].    Your next appointment:   9 month(s)  Provider:   Jennifer Crape, MD   Other Instructions  Cardiac Nuclear Scan A cardiac nuclear scan is a test that is done to check the flow of blood to your heart. It is done when you are resting and when you are exercising. The test looks for problems such as: Not enough blood reaching a portion of the heart. The heart muscle not working as it should. You may  need this test if you have: Heart disease. Lab results that are not normal. Had heart surgery or a balloon procedure to open up blocked arteries (angioplasty) or a small mesh tube (stent). Chest pain. Shortness of breath. Had a heart attack. In this test, a special dye (tracer) is put into your bloodstream. The tracer will travel to your heart. A camera will then take pictures of your heart to see how the tracer moves through your heart. This test is usually done at a hospital  and takes 2-4 hours. Tell a doctor about: Any allergies you have. All medicines you are taking, including vitamins, herbs, eye drops, creams, and over-the-counter medicines. Any bleeding problems you have. Any surgeries you have had. Any medical conditions you have. Whether you are pregnant or may be pregnant. Any history of asthma or long-term (chronic) lung disease. Any history of heart rhythm disorders or heart valve conditions. What are the risks? Your doctor will talk with you about risks. These may include: Serious chest pain and heart attack. This is only a risk if the stress portion of the test is done. Fast or uneven heartbeats (palpitations). A feeling of warmth in your chest. This feeling usually does not last long. Allergic reaction to the tracer. Shortness of breath or trouble breathing. What happens before the test? Ask your doctor about changing or stopping your normal medicines. Follow instructions from your doctor about what you cannot eat or drink. Remove your jewelry on the day of the test. Ask your doctor if you need to avoid nicotine or caffeine . What happens during the test? An IV tube will be inserted into one of your veins. Your doctor will give you a small amount of tracer through the IV tube. You will wait for 20-40 minutes while the tracer moves through your bloodstream. Your heart will be monitored with an electrocardiogram (ECG). You will lie down on an exam table. Pictures of your heart will be taken for about 15-20 minutes. You may also have a stress test. For this test, one of these things may be done: You will be asked to exercise on a treadmill or a stationary bike. You will be given medicines that will make your heart work harder. This is done if you are unable to exercise. When blood flow to your heart has peaked, a tracer will again be given through the IV tube. After 20-40 minutes, you will get back on the exam table. More pictures will be taken  of your heart. Depending on the tracer that is used, more pictures may need to be taken 3-4 hours later. Your IV tube will be removed when the test is over. The test may vary among doctors and hospitals. What happens after the test? Ask your doctor: Whether you can return to your normal schedule, including diet, activities, travel, and medicines. Whether you should drink more fluids. This will help to remove the tracer from your body. Ask your doctor, or the department that is doing the test: When will my results be ready? How will I get my results? What are my treatment options? What other tests do I need? What are my next steps? This information is not intended to replace advice given to you by your health care provider. Make sure you discuss any questions you have with your health care provider. Document Revised: 05/03/2022 Document Reviewed: 05/03/2022 Elsevier Patient Education  2023 [employer].

## 2024-01-04 ENCOUNTER — Ambulatory Visit: Payer: Medicaid Other | Attending: Cardiology

## 2024-01-04 DIAGNOSIS — R079 Chest pain, unspecified: Secondary | ICD-10-CM | POA: Diagnosis not present

## 2024-01-04 MED ORDER — TECHNETIUM TC 99M TETROFOSMIN IV KIT
30.5000 | PACK | Freq: Once | INTRAVENOUS | Status: AC | PRN
  Administered 2024-01-04: 30.5 via INTRAVENOUS

## 2024-01-04 MED ORDER — TECHNETIUM TC 99M TETROFOSMIN IV KIT
9.9000 | PACK | Freq: Once | INTRAVENOUS | Status: AC | PRN
  Administered 2024-01-04: 9.9 via INTRAVENOUS

## 2024-01-09 LAB — MYOCARDIAL PERFUSION IMAGING
Angina Index: 0
Duke Treadmill Score: 11
Estimated workload: 13.4
Exercise duration (min): 10 min
Exercise duration (sec): 30 s
LV dias vol: 69 mL (ref 46–106)
LV sys vol: 30 mL
MPHR: 159 {beats}/min
Nuc Stress EF: 57 %
Peak HR: 151 {beats}/min
Percent HR: 94 %
Rest HR: 69 {beats}/min
Rest Nuclear Isotope Dose: 9.9 mCi
SDS: 0
SRS: 1
SSS: 1
ST Depression (mm): 0 mm
Stress Nuclear Isotope Dose: 30.5 mCi
TID: 0.99

## 2024-01-10 ENCOUNTER — Encounter: Payer: Self-pay | Admitting: Family Medicine

## 2024-01-17 DIAGNOSIS — I10 Essential (primary) hypertension: Secondary | ICD-10-CM | POA: Diagnosis not present

## 2024-01-17 DIAGNOSIS — E782 Mixed hyperlipidemia: Secondary | ICD-10-CM | POA: Diagnosis not present

## 2024-01-18 LAB — COMPREHENSIVE METABOLIC PANEL
ALT: 21 [IU]/L (ref 0–32)
AST: 23 [IU]/L (ref 0–40)
Albumin: 4.5 g/dL (ref 3.9–4.9)
Alkaline Phosphatase: 64 [IU]/L (ref 44–121)
BUN/Creatinine Ratio: 12 (ref 12–28)
BUN: 12 mg/dL (ref 8–27)
Bilirubin Total: 0.5 mg/dL (ref 0.0–1.2)
CO2: 23 mmol/L (ref 20–29)
Calcium: 9.5 mg/dL (ref 8.7–10.3)
Chloride: 103 mmol/L (ref 96–106)
Creatinine, Ser: 1.03 mg/dL — ABNORMAL HIGH (ref 0.57–1.00)
Globulin, Total: 2.9 g/dL (ref 1.5–4.5)
Glucose: 87 mg/dL (ref 70–99)
Potassium: 4.6 mmol/L (ref 3.5–5.2)
Sodium: 140 mmol/L (ref 134–144)
Total Protein: 7.4 g/dL (ref 6.0–8.5)
eGFR: 62 mL/min/{1.73_m2} (ref >59)

## 2024-01-18 LAB — TSH: TSH: 0.053 u[IU]/mL — ABNORMAL LOW (ref 0.450–4.500)

## 2024-01-18 LAB — LIPID PANEL
Chol/HDL Ratio: 5 {ratio} — ABNORMAL HIGH (ref 0.0–4.4)
Cholesterol, Total: 244 mg/dL — ABNORMAL HIGH (ref 100–199)
HDL: 49 mg/dL (ref >39)
LDL Chol Calc (NIH): 169 mg/dL — ABNORMAL HIGH (ref 0–99)
Triglycerides: 144 mg/dL (ref 0–149)
VLDL Cholesterol Cal: 26 mg/dL (ref 5–40)

## 2024-01-20 DIAGNOSIS — Z419 Encounter for procedure for purposes other than remedying health state, unspecified: Secondary | ICD-10-CM | POA: Diagnosis not present

## 2024-02-07 ENCOUNTER — Ambulatory Visit: Payer: Medicare Other | Admitting: Cardiology

## 2024-02-17 DIAGNOSIS — Z419 Encounter for procedure for purposes other than remedying health state, unspecified: Secondary | ICD-10-CM | POA: Diagnosis not present

## 2024-02-23 ENCOUNTER — Encounter: Payer: Self-pay | Admitting: Cardiology

## 2024-02-23 DIAGNOSIS — I10 Essential (primary) hypertension: Secondary | ICD-10-CM

## 2024-02-23 DIAGNOSIS — E782 Mixed hyperlipidemia: Secondary | ICD-10-CM

## 2024-02-23 DIAGNOSIS — I700 Atherosclerosis of aorta: Secondary | ICD-10-CM

## 2024-02-23 MED ORDER — LOSARTAN POTASSIUM 100 MG PO TABS
100.0000 mg | ORAL_TABLET | Freq: Every day | ORAL | 3 refills | Status: DC
Start: 2024-02-23 — End: 2024-03-21

## 2024-02-27 ENCOUNTER — Other Ambulatory Visit (HOSPITAL_BASED_OUTPATIENT_CLINIC_OR_DEPARTMENT_OTHER)

## 2024-02-29 ENCOUNTER — Ambulatory Visit
Admission: RE | Admit: 2024-02-29 | Discharge: 2024-02-29 | Disposition: A | Discharge status: Home / Self Care | Payer: Self-pay | Source: Ambulatory Visit | Attending: Cardiology | Admitting: Cardiology

## 2024-02-29 DIAGNOSIS — E782 Mixed hyperlipidemia: Secondary | ICD-10-CM | POA: Insufficient documentation

## 2024-02-29 DIAGNOSIS — I700 Atherosclerosis of aorta: Secondary | ICD-10-CM | POA: Insufficient documentation

## 2024-03-08 ENCOUNTER — Telehealth: Payer: Self-pay

## 2024-03-08 DIAGNOSIS — E782 Mixed hyperlipidemia: Secondary | ICD-10-CM

## 2024-03-08 NOTE — Telephone Encounter (Signed)
 MyChart message

## 2024-03-08 NOTE — Telephone Encounter (Signed)
-----   Message from Farrell Revankar sent at 03/07/2024 10:52 AM EDT ----- Markedly elevated calcium score.  Please bring patient in for Chem-7 liver lipid check.  Will need statin therapy.  Give appointment to see me in the next couple of weeks.  Copy primary care.  We are awaiting noncardiac reports.  Please let the patient know. Garwin Brothers, MD 03/07/2024 10:52 AM

## 2024-03-12 ENCOUNTER — Telehealth: Payer: Self-pay

## 2024-03-12 DIAGNOSIS — I700 Atherosclerosis of aorta: Secondary | ICD-10-CM

## 2024-03-12 DIAGNOSIS — E782 Mixed hyperlipidemia: Secondary | ICD-10-CM

## 2024-03-12 MED ORDER — ROSUVASTATIN CALCIUM 10 MG PO TABS
10.0000 mg | ORAL_TABLET | Freq: Every day | ORAL | 3 refills | Status: DC
Start: 2024-03-12 — End: 2024-03-21

## 2024-03-12 NOTE — Telephone Encounter (Signed)
 MyChart message

## 2024-03-12 NOTE — Telephone Encounter (Signed)
-----   Message from [place] Revankar sent at 03/12/2024 11:44 AM EDT ----- Rosuvastatin 10 mg daily.  Diet.  Best to have her see our dietitian also.  Liver lipid check in 6 weeks.  Copy primary.  Markedly elevated calcium score. Garwin Brothers, MD 03/12/2024 11:43 AM

## 2024-03-13 NOTE — Addendum Note (Signed)
 Addended by: Eleonore Chiquito on: 03/13/2024 07:55 AM   Modules accepted: Orders

## 2024-03-18 ENCOUNTER — Other Ambulatory Visit: Payer: Self-pay | Admitting: Family Medicine

## 2024-03-18 DIAGNOSIS — Z1231 Encounter for screening mammogram for malignant neoplasm of breast: Secondary | ICD-10-CM

## 2024-03-19 DIAGNOSIS — E782 Mixed hyperlipidemia: Secondary | ICD-10-CM | POA: Diagnosis not present

## 2024-03-20 LAB — HEPATIC FUNCTION PANEL
ALT: 14 IU/L (ref 0–32)
AST: 19 IU/L (ref 0–40)
Albumin: 4.6 g/dL (ref 3.9–4.9)
Alkaline Phosphatase: 65 IU/L (ref 44–121)
Bilirubin Total: 0.5 mg/dL (ref 0.0–1.2)
Bilirubin, Direct: 0.16 mg/dL (ref 0.00–0.40)
Total Protein: 7.6 g/dL (ref 6.0–8.5)

## 2024-03-20 LAB — LIPID PANEL
Chol/HDL Ratio: 4.8 ratio — ABNORMAL HIGH (ref 0.0–4.4)
Cholesterol, Total: 248 mg/dL — ABNORMAL HIGH (ref 100–199)
HDL: 52 mg/dL (ref >39)
LDL Chol Calc (NIH): 180 mg/dL — ABNORMAL HIGH (ref 0–99)
Triglycerides: 93 mg/dL (ref 0–149)
VLDL Cholesterol Cal: 16 mg/dL (ref 5–40)

## 2024-03-21 ENCOUNTER — Ambulatory Visit: Attending: Cardiology | Admitting: Cardiology

## 2024-03-21 ENCOUNTER — Encounter: Payer: Self-pay | Admitting: Cardiology

## 2024-03-21 VITALS — BP 138/74 | HR 76 | Ht 66.0 in | Wt 128.1 lb

## 2024-03-21 DIAGNOSIS — I700 Atherosclerosis of aorta: Secondary | ICD-10-CM | POA: Diagnosis not present

## 2024-03-21 DIAGNOSIS — E782 Mixed hyperlipidemia: Secondary | ICD-10-CM | POA: Diagnosis not present

## 2024-03-21 DIAGNOSIS — I2510 Atherosclerotic heart disease of native coronary artery without angina pectoris: Secondary | ICD-10-CM | POA: Insufficient documentation

## 2024-03-21 MED ORDER — ROSUVASTATIN CALCIUM 20 MG PO TABS: 20.0000 mg | ORAL_TABLET | Freq: Every day | ORAL | 3 refills | Status: DC

## 2024-03-21 NOTE — Patient Instructions (Signed)
 Medication Instructions:  Your physician has recommended you make the following change in your medication:   START: Rosuvastatin 20 mg daily START: Co Q10 OTC  *If you need a refill on your cardiac medications before your next appointment, please call your pharmacy*  Lab Work: Your physician recommends that you return for lab work in:   Labs in 6 weeks: BMP, LFT, Lipids  If you have labs (blood work) drawn today and your tests are completely normal, you will receive your results only by: MyChart Message (if you have MyChart) OR A paper copy in the mail If you have any lab test that is abnormal or we need to change your treatment, we will call you to review the results.  Testing/Procedures: None  Follow-Up: At [facility], you and your health needs are our priority.  As part of our continuing mission to provide you with exceptional heart care, our providers are all part of one team.  This team includes your primary Cardiologist (physician) and Advanced Practice Providers or APPs (Physician Assistants and Nurse Practitioners) who all work together to provide you with the care you need, when you need it.  Your next appointment:   1 year(s)  Provider:   Belva Crome, MD    We recommend signing up for the patient portal called "MyChart".  Sign up information is provided on this After Visit Summary.  MyChart is used to connect with patients for Virtual Visits (Telemedicine).  Patients are able to view lab/test results, encounter notes, upcoming appointments, etc.  Non-urgent messages can be sent to your provider as well.   To learn more about what you can do with MyChart, go to [email or web address].   Other Instructions None

## 2024-03-21 NOTE — Progress Notes (Signed)
 Cardiology Office Note:    Date:  03/21/2024   ID:  [name], DOB 05-01-1961, MRN [id number]  PCP:  Sandre Kitty, MD  Cardiologist:  Garwin Brothers, MD   Referring MD: Sandre Kitty, MD    ASSESSMENT:    1. Coronary artery disease involving native coronary artery of native heart without angina pectoris   2. Atherosclerosis of aorta (HCC)   3. Mixed hyperlipidemia    PLAN:    In order of problems listed above:  Elevated calcium score: Coronary artery disease: Markedly elevated calcium score.  I discussed the report with her at extensive length.  She vocalized understanding.  Questions were answered to her satisfaction.  She was advised to walk at least half an hour a day on a daily basis and she promises to do so. Familial hyperlipidemia: Lipids are markedly elevated.  In the past she has been very resistant to taking medications.  She is now willing to take rosuvastatin 20 mg daily.  She will be back in 6 weeks for liver lipid check.  I told her to take co-Q10 over-the-counter also.  Benefits and potential risks explained and she vocalized understanding and questions were answered to her satisfaction. Patient will be seen in follow-up appointment in 12 months or earlier if the patient has any concerns.    Medication Adjustments/Labs and Tests Ordered: Current medicines are reviewed at length with the patient today.  Concerns regarding medicines are outlined above.  No orders of the defined types were placed in this encounter.  No orders of the defined types were placed in this encounter.    No chief complaint on file.    History of Present Illness:    [name] is a 63 y.o. female.  Patient has past medical history of markedly elevated calcium score.  She has history of familial hyper dyslipidemia.  She denies any problems at this time and takes care of activities of daily living.  No chest pain orthopnea or PND.  She exercises on a regular basis.  At the  time of my evaluation, the patient is alert awake oriented and in no distress.  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    Past Surgical History:  Procedure Laterality Date   ABDOMINAL HYSTERECTOMY     CESAREAN SECTION     x2   CHOLECYSTECTOMY     I & D EXTREMITY Right 03/07/2013   Procedure: IRRIGATION AND DEBRIDEMENT RIGHT LONG FINGER;  Surgeon: Tami Ribas, MD;  Location: [facility];  Service: Orthopedics;  Laterality: Right;   TONSILLECTOMY     TUBAL LIGATION     ULNAR COLLATERAL LIGAMENT REPAIR Left 02/14/2023   Procedure: LEFT THUMB ULNAR COLLATERAL LIGAMENT REPAIR;  Surgeon: Betha Loa, MD;  Location: [facility];  Service: Orthopedics;  Laterality: Left;  60 MIN   WRIST ARTHROSCOPY  2011x2   tendon repair -left   WRIST ARTHROSCOPY     rt  Current Medications: Current Meds  Medication Sig   acetaminophen-codeine (TYLENOL #3) 300-30 MG tablet Take 1 tablet by mouth every 6 (six) hours as needed for moderate pain.   aspirin EC 81 MG tablet Take 81 mg by mouth daily. Swallow whole.   EC-RX Testosterone 0.4 % CREA Place 1 Application onto the skin at bedtime.   Estradiol-Estriol-Progesterone (BIEST/PROGESTERONE) CREA Place 1 application onto the skin daily.   finasteride (PROSCAR) 5 MG tablet Take 2.5 mg by mouth daily.   levothyroxine (SYNTHROID) 100 MCG tablet Take 100 mcg by mouth daily before breakfast.   liothyronine (CYTOMEL) 5 MCG tablet Take 15 mcg by mouth daily.   losartan (COZAAR)  50 MG tablet Take 50 mg by mouth daily.   NALTREXONE HCL PO Take 7.5 mg by mouth at bedtime.   Nutritional Supplements (JUICE PLUS FIBRE PO) Take 1 each by mouth daily.   Progesterone Micronized (PROGESTERONE PO) Take 200 mg by mouth at bedtime.   [DISCONTINUED] rosuvastatin (CRESTOR) 10 MG tablet Take 1 tablet (10 mg total) by mouth daily.     Allergies:   Ancef [cefazolin], Codeine, Amlodipine, Hydrocodone, Iohexol, Percocet [oxycodone-acetaminophen], and Prednisone   Social History   Socioeconomic History   Marital status: Married    Spouse name: Not on file   Number of children: 2   Years of education: Not on file   Highest education level: High school graduate  Occupational History   Not on file  Tobacco Use   Smoking status: Never   Smokeless tobacco: Never  Vaping Use   Vaping status: Never Used  Substance and Sexual Activity   Alcohol use: Yes    Comment: "a glass of wine every now and then"   Drug use: No   Sexual activity: Yes    Birth control/protection: Surgical  Other Topics Concern   Not on file  Social History Narrative   Not on file   Social Drivers of Health   Financial Resource Strain: Not on file  Food Insecurity: No Food Insecurity (03/09/2023)   Hunger Vital Sign    Worried About Running Out of Food in the Last Year: Never true    Ran Out of Food in the Last Year: Never true  Transportation Needs: No Transportation Needs (03/09/2023)   PRAPARE - Administrator, Civil Service (Medical): No    Lack of Transportation (Non-Medical): No  Physical Activity: Not on file  Stress: Not on file  Social Connections: Not on file     Family History: The patient's family history includes Hypertension in her father and mother. There is no history of Breast cancer.  ROS:   Please see the history of present illness.    All other systems reviewed and are negative.  EKGs/Labs/Other Studies Reviewed:    The following studies were reviewed today: I  discussed my findings with the patient at length   Recent Labs: 08/16/2023: Hemoglobin 13.1; Platelets 243 01/17/2024: BUN 12; Creatinine, Ser 1.03; Potassium 4.6; Sodium 140; TSH 0.053 03/19/2024: ALT 14  Recent Lipid Panel    Component Value Date/Time   CHOL 248 (H) 03/19/2024 0817   TRIG 93 03/19/2024 0817   HDL 52 03/19/2024 0817   CHOLHDL 4.8 (H) 03/19/2024 0817   LDLCALC 180 (H) 03/19/2024 0817    Physical Exam:    VS:  BP 138/74   Pulse 76   Ht 5\' 6"  (1.676 m)   Wt 128 lb 1.9 oz (58.1 kg)   SpO2 98%  BMI 20.68 kg/m     Wt Readings from Last 3 Encounters:  03/21/24 128 lb 1.9 oz (58.1 kg)  01/04/24 132 lb (59.9 kg)  12/28/23 132 lb (59.9 kg)     GEN: Patient is in no acute distress HEENT: Normal NECK: No JVD; No carotid bruits LYMPHATICS: No lymphadenopathy CARDIAC: Hear sounds regular, 2/6 systolic murmur at the apex. RESPIRATORY:  Clear to auscultation without rales, wheezing or rhonchi  ABDOMEN: Soft, non-tender, non-distended MUSCULOSKELETAL:  No edema; No deformity  SKIN: Warm and dry NEUROLOGIC:  Alert and oriented x 3 PSYCHIATRIC:  Normal affect   Signed, Garwin Brothers, MD  03/21/2024 9:35 AM    [facility]

## 2024-03-27 ENCOUNTER — Encounter: Payer: Self-pay | Admitting: Family Medicine

## 2024-03-27 ENCOUNTER — Other Ambulatory Visit: Payer: Self-pay

## 2024-03-27 ENCOUNTER — Ambulatory Visit: Admitting: Family Medicine

## 2024-03-27 VITALS — BP 139/71 | HR 72 | Ht 66.0 in | Wt 130.8 lb

## 2024-03-27 DIAGNOSIS — I10 Essential (primary) hypertension: Secondary | ICD-10-CM

## 2024-03-27 DIAGNOSIS — E782 Mixed hyperlipidemia: Secondary | ICD-10-CM | POA: Diagnosis not present

## 2024-03-27 DIAGNOSIS — R222 Localized swelling, mass and lump, trunk: Secondary | ICD-10-CM

## 2024-03-27 NOTE — Patient Instructions (Signed)
 It was nice to see you today,  We addressed the following topics today: -I will send in an ultrasound of the lump on your back.  Someone from [facility] will call you to schedule it.  It is most likely a lipoma or cyst.  If it is anything concerning on the ultrasound or if it is inconclusive we can talk about further workup. - I will add the losartan to your med list here.  Have a great day,  Frederic Jericho, MD

## 2024-03-27 NOTE — Progress Notes (Unsigned)
   Established Patient Office Visit  Subjective   Patient ID: [name], female    DOB: 23-Oct-1961  Age: 63 y.o. MRN: [id number]  Chief Complaint  Patient presents with   spot on back    HPI  Patient here today for a "spot on her back".  She first noticed that 6 months ago.  It is under the skin on the right side of her lumbar region.  It is not tender.  As increase in size slightly.  No other skin changes surrounding it.  Hypertension-patient is taking losartan.  This was prescribed by her cardiologist.  She has had some episodes of lightheadedness around midday since adding rosuvastatin as well.  CAD/HLD-we discussed the patient's CAC scoring since her last visit with me.  She recently started Crestor.  He is tolerating it so far after a few weeks of taking it.  No muscle aches or pains.    The 10-year ASCVD risk score (Arnett DK, et al., 2019) is: 5.2%  Health Maintenance Due  Topic Date Due   Pneumococcal Vaccine 8-70 Years old (1 of 2 - PCV) Never done   HIV Screening  Never done   Hepatitis C Screening  Never done   Cervical Cancer Screening (HPV/Pap Cotest)  Never done   Colonoscopy  Never done   Zoster Vaccines- Shingrix (1 of 2) Never done   COVID-19 Vaccine (1 - 2024-25 season) Never done      Objective:     BP 139/71   Pulse 72   Ht 5\' 6"  (1.676 m)   Wt 130 lb 12.8 oz (59.3 kg)   SpO2 99%   BMI 21.11 kg/m  {Vitals History (Optional):23777}  Physical Exam General: Alert, oriented Pulmonary: No respiratory stress MSK: Posterior right sided lumbar region has small approximately 1.5 cm subcutaneous mobile mass.  Nontender.  No skin changes overlying.   No results found for any visits on 03/27/24.      Assessment & Plan:   There are no diagnoses linked to this encounter.   No follow-ups on file.    Sandre Kitty, MD

## 2024-03-29 DIAGNOSIS — R222 Localized swelling, mass and lump, trunk: Secondary | ICD-10-CM | POA: Insufficient documentation

## 2024-03-29 NOTE — Assessment & Plan Note (Signed)
 Since we last discussed her cholesterol levels they have increased.  She has seen a cardiologist in this time for her blood pressure but due to the elevated cholesterol levels they agreed to a coronary artery calcium scan that showed significantly elevated score of 701 in the 99th percentile.  She has started Crestor since then.  Tolerating it well so far.

## 2024-03-29 NOTE — Assessment & Plan Note (Signed)
 Patient taking losartan prescribed by her cardiologist.

## 2024-03-29 NOTE — Assessment & Plan Note (Addendum)
 Likely lipoma, possibly cyst.  Reassured patient that it is most likely benign.  Patient wishes to evaluate further with soft tissue ultrasound.  Will send this in.

## 2024-03-30 DIAGNOSIS — Z419 Encounter for procedure for purposes other than remedying health state, unspecified: Secondary | ICD-10-CM | POA: Diagnosis not present

## 2024-04-02 ENCOUNTER — Ambulatory Visit
Admission: RE | Admit: 2024-04-02 | Discharge: 2024-04-02 | Disposition: A | Discharge status: Home / Self Care | Source: Ambulatory Visit | Attending: Family Medicine | Admitting: Family Medicine

## 2024-04-02 DIAGNOSIS — Z1231 Encounter for screening mammogram for malignant neoplasm of breast: Secondary | ICD-10-CM | POA: Diagnosis not present

## 2024-04-03 ENCOUNTER — Ambulatory Visit
Admission: RE | Admit: 2024-04-03 | Discharge: 2024-04-03 | Disposition: A | Discharge status: Home / Self Care | Source: Ambulatory Visit | Attending: Family Medicine | Admitting: Family Medicine

## 2024-04-03 DIAGNOSIS — R222 Localized swelling, mass and lump, trunk: Secondary | ICD-10-CM | POA: Diagnosis not present

## 2024-04-03 NOTE — Progress Notes (Unsigned)
 Medical Nutrition Therapy  Appointment Start time:  ***  Appointment End time:  ***  Primary concerns today: ***  Referral diagnosis: Mixed hyperlipidemia, Atherosclerosis of aorta  Preferred learning style: *** (auditory, visual, hands on, no preference indicated) Learning readiness: *** (not ready, contemplating, ready, change in progress)   NUTRITION ASSESSMENT    Clinical Medical Hx: *** Medications: *** Labs:  Lab Results  Component Value Date   CHOL 248 (H) 03/19/2024   HDL 52 03/19/2024   LDLCALC 180 (H) 03/19/2024   TRIG 93 03/19/2024   CHOLHDL 4.8 (H) 03/19/2024   No results found for: "HGBA1C" Lab Results  Component Value Date   ALT 14 03/19/2024   AST 19 03/19/2024   ALKPHOS 65 03/19/2024   BILITOT 0.5 03/19/2024   Notable Signs/Symptoms: ***  Lifestyle & Dietary Hx ***  Estimated daily fluid intake: *** oz Supplements: *** Sleep: *** Stress / self-care: *** Current average weekly physical activity: ***  24-Hr Dietary Recall First Meal: *** Snack: *** Second Meal: *** Snack: *** Third Meal: *** Snack: *** Beverages: ***  Estimated Energy Needs Calories: *** Carbohydrate: ***g Protein: ***g Fat: ***g   NUTRITION DIAGNOSIS  {CHL AMB NUTRITIONAL DIAGNOSIS:[phone]}   NUTRITION INTERVENTION  Nutrition education (E-1) on the following topics:  ***  Handouts Provided Include  ***  Learning Style & Readiness for Change Teaching method utilized: Visual & Auditory  Demonstrated degree of understanding via: Teach Back  Barriers to learning/adherence to lifestyle change: ***  Goals Established by Pt ***   MONITORING & EVALUATION Dietary intake, weekly physical activity, and *** in ***.  Next Steps  Patient is to ***.

## 2024-04-04 ENCOUNTER — Ambulatory Visit: Admitting: Dietician

## 2024-04-04 ENCOUNTER — Encounter: Attending: Family Medicine | Admitting: Dietician

## 2024-04-04 DIAGNOSIS — E782 Mixed hyperlipidemia: Secondary | ICD-10-CM | POA: Insufficient documentation

## 2024-04-04 DIAGNOSIS — I700 Atherosclerosis of aorta: Secondary | ICD-10-CM | POA: Diagnosis not present

## 2024-04-22 ENCOUNTER — Encounter: Payer: Self-pay | Admitting: Family Medicine

## 2024-04-25 NOTE — Progress Notes (Unsigned)
 Medical Nutrition Therapy  Appointment Start time:  0800  Appointment End time:  [phone]  Primary concerns today: improving cholesterol with less medications  Referral diagnosis: Mixed hyperlipidemia, Atherosclerosis of aorta  Preferred learning style:  no preference indicated Learning readiness:  change in progress   NUTRITION ASSESSMENT    Clinical Medical Hx:  Past Medical History:  Diagnosis Date   CHEST WALL PAIN, ANTERIOR 01/23/2009   Qualifier: Diagnosis of   By: Nolene Baumgarten MD, KARL         Contact lens/glasses fitting    wears contacts or glasses   Hyperlipidemia 07/10/2023   Hypertension    Hypothyroidism    Hypothyroidism due to Hashimoto's thyroiditis 07/10/2023   PONV (postoperative nausea and vomiting)    Screening breast examination 08/06/2019   Stage 2 chronic kidney disease 09/26/2023   Thyroid  condition    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 Swallow whole., Disp: , Rfl:    Coenzyme Q10 (COQ-10 PO), Take 1 capsule by mouth daily. (Patient not taking: Reported on 04/04/2024), Disp: , Rfl:    EC-RX Testosterone 0.4 % CREA, Place 1 Application onto the skin at bedtime., Disp: , Rfl:    Estradiol-Estriol-Progesterone (BIEST/PROGESTERONE) CREA, Place 1 application onto the skin daily., Disp: , Rfl:    finasteride (PROSCAR) 5 MG tablet, Take 2.5 mg by mouth daily., Disp: , Rfl:    levothyroxine (SYNTHROID) 100 MCG tablet, Take 100 mcg by mouth daily before breakfast., Disp: , Rfl:    liothyronine (CYTOMEL) 5 MCG tablet, Take 15 mcg by mouth daily., Disp: , Rfl:    losartan  (COZAAR ) 50 MG tablet, Take 50 mg by mouth daily., Disp: , Rfl:    Nutritional Supplements (JUICE PLUS FIBRE PO), Take 1 each by mouth daily., Disp: , Rfl:     Progesterone Micronized (PROGESTERONE PO), Take 200 mg by mouth at bedtime., Disp: , Rfl:    rosuvastatin  (CRESTOR ) 20 MG tablet, Take 1 tablet (20 mg total) by mouth daily., Disp: 90 tablet, Rfl: 3  Labs:   Lab Results  Component Value Date   CHOL 118 04/26/2024   HDL 46 04/26/2024   LDLCALC 56 04/26/2024   TRIG 77 04/26/2024   CHOLHDL 2.6 04/26/2024   Lab Results  Component Value Date   CHOL 248 (H) 03/19/2024   HDL 52 03/19/2024   LDLCALC 180 (H) 03/19/2024   TRIG 93 03/19/2024   CHOLHDL 4.8 (H) 03/19/2024   Lifestyle & Dietary Hx  Pt presents today alone for follow up. Pt reports she is happy with the improvements with her lipid panel. Pt states she is concerned that low cholesterol could be bad for her brain health and has discussed this with her PCP. Pt reports she is now taking a supplement lipid factors by [facility] twice daily. Pt reports she is following NAS meal pattern. Pt reports cooked at home more recently and decrease eating out to three times weekly. Pt reports typical intake of two meals with 1-2 snacks daily.  Pt reports she aims for gluten free grains and selecting low fat cheeses and plant based milk  All Pt's questions were answered during this encounter.   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, gym 2/d/w cardio 2 miles or 20 minutes, 30 weight machines 2/d/w   24-Hr Dietary Recall First Meal: GF oatmeal with oat milk, water, coffee with agave Snack: none Second Meal: protein shake 2/d/w or whole foods buffet or grilled chicken wrap with spinach, feta or 1/2 slice sour dough bread toast with pimento cheese, BBQ chips, water Snack: 2% yogurt, nuts, honey, granola, berries Third Meal: chicken pot pie, brown rice or baked beans with burger on a bun, slices tomato,  water Snack: none reported Beverages: water, coffee with agave   24-Hr Dietary Recall First Meal: oat milk or whole milk, oatmeal with honey, butter, 4 strawberries, coffee with agave or sour dough with jelly and butter or peanut butter  Snack: banana or fruit Second Meal: BBQ sandwich with cole slaw, sweet tea or [employer] vegetable plate with sweet potatoes, green beans, broccoli or whole foods sandwich, water Snack: none Third Meal: protein shake 2/d/w or salmon, salad or GF noodles, grass fed beef, red sauce, salad with poppy seed dressing  Snack: peanut butter with 1/2 slice of sour dough Beverages: EtOH 3 days weekly, zevia selzer, sweet tea once monthly, water  NUTRITION DIAGNOSIS  NB-1.1 Food and nutrition-related knowledge deficit As related to no prior nutrition related education.  As evidenced by Pt report and dietary recall.   NUTRITION INTERVENTION  Nutrition education (E-1) on the following topics:  Fruits & Vegetables: Aim to fill half your plate with a variety of fruits and vegetables. They are rich in vitamins, minerals, and fiber, and can help reduce the risk of chronic diseases. Choose a colorful assortment of fruits and vegetables to ensure you get a wide range of nutrients. Grains and Starches: Make at least half of your grain choices whole grains, such as brown rice, whole wheat bread, and oats. Whole grains provide fiber, which aids in digestion and healthy cholesterol levels. Aim for whole forms of starchy vegetables such as potatoes, sweet potatoes, beans, peas, and corn, which are fiber rich and provide many vitamins and minerals.  Protein: Incorporate lean sources of protein, such as poultry, fish, beans, nuts, and seeds, into your meals. Protein is essential for building and repairing tissues, staying full, balancing blood sugar, as well as supporting immune function. Select proteins low in saturated fat by removing skin from poultry and trimming fat before or after  cooking.  Dairy: Include low-fat or fat-free dairy products like milk, yogurt, and cheese in your diet. Dairy foods are excellent sources of calcium  and vitamin D, which are crucial for bone health.  Physical Activity: Aim for 60 minutes of physical activity daily. Regular physical activity promotes overall health-including helping to reduce risk for heart disease and diabetes, promoting mental health, and helping us  sleep better.    Handouts Provided Reviewed Plate Plate  Heart Healthy Nutrition Label reading Tips  Learning Style & Readiness for Change Teaching method utilized: Visual & Auditory  Demonstrated degree of understanding via: Teach Back  Barriers to learning/adherence to lifestyle change: none  Goals Established by Pt Eat out 1-2 weekly or less-improved, continue Select products with lower saturated fat and sodium-improved, continue   MONITORING & EVALUATION Dietary intake, weekly physical activity, and labs and blood pressure  Next Steps  Patient is to return ~ 7 weeks.

## 2024-04-26 DIAGNOSIS — E782 Mixed hyperlipidemia: Secondary | ICD-10-CM | POA: Diagnosis not present

## 2024-04-26 DIAGNOSIS — I2510 Atherosclerotic heart disease of native coronary artery without angina pectoris: Secondary | ICD-10-CM | POA: Diagnosis not present

## 2024-04-26 DIAGNOSIS — I700 Atherosclerosis of aorta: Secondary | ICD-10-CM | POA: Diagnosis not present

## 2024-04-27 LAB — BASIC METABOLIC PANEL WITH GFR
BUN/Creatinine Ratio: 16 (ref 12–28)
BUN: 16 mg/dL (ref 8–27)
CO2: 21 mmol/L (ref 20–29)
Calcium: 9.4 mg/dL (ref 8.7–10.3)
Chloride: 102 mmol/L (ref 96–106)
Creatinine, Ser: 1 mg/dL (ref 0.57–1.00)
Glucose: 100 mg/dL — ABNORMAL HIGH (ref 70–99)
Potassium: 4.3 mmol/L (ref 3.5–5.2)
Sodium: 139 mmol/L (ref 134–144)
eGFR: 64 mL/min/{1.73_m2} (ref >59)

## 2024-04-27 LAB — LIPID PANEL
Chol/HDL Ratio: 2.6 ratio (ref 0.0–4.4)
Cholesterol, Total: 118 mg/dL (ref 100–199)
HDL: 46 mg/dL (ref >39)
LDL Chol Calc (NIH): 56 mg/dL (ref 0–99)
Triglycerides: 77 mg/dL (ref 0–149)
VLDL Cholesterol Cal: 16 mg/dL (ref 5–40)

## 2024-04-27 LAB — HEPATIC FUNCTION PANEL
ALT: 14 IU/L (ref 0–32)
AST: 17 IU/L (ref 0–40)
Albumin: 4.5 g/dL (ref 3.9–4.9)
Alkaline Phosphatase: 67 IU/L (ref 44–121)
Bilirubin Total: 0.6 mg/dL (ref 0.0–1.2)
Bilirubin, Direct: 0.21 mg/dL (ref 0.00–0.40)
Total Protein: 7.1 g/dL (ref 6.0–8.5)

## 2024-04-29 DIAGNOSIS — Z419 Encounter for procedure for purposes other than remedying health state, unspecified: Secondary | ICD-10-CM | POA: Diagnosis not present

## 2024-04-30 ENCOUNTER — Ambulatory Visit: Payer: Self-pay

## 2024-04-30 DIAGNOSIS — E782 Mixed hyperlipidemia: Secondary | ICD-10-CM

## 2024-04-30 DIAGNOSIS — I2510 Atherosclerotic heart disease of native coronary artery without angina pectoris: Secondary | ICD-10-CM

## 2024-05-01 ENCOUNTER — Encounter: Attending: Cardiology | Admitting: Dietician

## 2024-05-01 DIAGNOSIS — E782 Mixed hyperlipidemia: Secondary | ICD-10-CM | POA: Diagnosis not present

## 2024-05-01 DIAGNOSIS — I700 Atherosclerosis of aorta: Secondary | ICD-10-CM | POA: Insufficient documentation

## 2024-05-01 MED ORDER — ROSUVASTATIN CALCIUM 10 MG PO TABS: 10.0000 mg | ORAL_TABLET | Freq: Every day | ORAL | 3 refills | Status: AC

## 2024-05-01 NOTE — Addendum Note (Signed)
 Addended by: Einar Grave on: 05/01/2024 03:51 PM   Modules accepted: Orders

## 2024-05-30 DIAGNOSIS — Z419 Encounter for procedure for purposes other than remedying health state, unspecified: Secondary | ICD-10-CM | POA: Diagnosis not present

## 2024-06-19 NOTE — Progress Notes (Deleted)
 Medical Nutrition Therapy  Appointment Start time:  0800  Appointment End time:  [phone]  Primary concerns today: improving cholesterol with less medications  Referral diagnosis: Mixed hyperlipidemia, Atherosclerosis of aorta  Preferred learning style:  no preference indicated Learning readiness:  change in progress   NUTRITION ASSESSMENT    Clinical Medical Hx:  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 Swallow whole., Disp: , Rfl:    Coenzyme Q10 (COQ-10 PO), Take 1 capsule by mouth daily. (Patient not taking: Reported on 04/04/2024), Disp: , Rfl:    EC-RX Testosterone 0.4 % CREA, Place 1 Application onto the skin at bedtime., Disp: , Rfl:    Estradiol-Estriol-Progesterone (BIEST/PROGESTERONE) CREA, Place 1 application onto the skin daily., Disp: , Rfl:    finasteride (PROSCAR) 5 MG tablet, Take 2.5 mg by mouth daily., Disp: , Rfl:    levothyroxine (SYNTHROID) 100 MCG tablet, Take 100 mcg by mouth daily before breakfast., Disp: , Rfl:    liothyronine (CYTOMEL) 5 MCG tablet, Take 15 mcg by mouth daily., Disp: , Rfl:    losartan  (COZAAR ) 50 MG tablet, Take 50 mg by mouth daily., Disp: , Rfl:    Nutritional Supplements (JUICE PLUS FIBRE PO), Take 1 each by mouth daily., Disp: , Rfl:     Progesterone Micronized (PROGESTERONE PO), Take 200 mg by mouth at bedtime., Disp: , Rfl:    rosuvastatin  (CRESTOR ) 10 MG tablet, Take 1 tablet (10 mg total) by mouth daily., Disp: 90 tablet, Rfl: 3  Labs:   Lab Results  Component Value Date   CHOL 118 04/26/2024   HDL 46 04/26/2024   LDLCALC 56 04/26/2024   TRIG 77 04/26/2024   CHOLHDL 2.6 04/26/2024   Lab Results  Component Value Date   CHOL 248 (H) 03/19/2024   HDL 52 03/19/2024   LDLCALC 180 (H) 03/19/2024   TRIG 93 03/19/2024   CHOLHDL 4.8 (H) 03/19/2024   Lifestyle & Dietary Hx  Pt presents today alone for follow up. Pt reports she is happy with the improvements with her lipid panel. Pt states she is concerned that low cholesterol could be bad for her brain health and has discussed this with her PCP. Pt reports she is now taking a supplement lipid factors by [facility] twice daily. Pt reports she is following NAS meal pattern. Pt reports cooked at home more recently and decrease eating out to three times weekly. Pt reports typical intake of two meals with 1-2 snacks daily.  Pt reports she aims for gluten free grains and selecting low fat cheeses and plant based milk  All Pt's questions were answered during this encounter.   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, gym 2/d/w cardio 2 miles or 20 minutes, 30 weight machines 2/d/w   24-Hr Dietary Recall First Meal: GF oatmeal with oat milk, water, coffee with agave Snack: none Second Meal: protein shake 2/d/w or whole foods buffet or grilled chicken wrap with spinach, feta or 1/2 slice sour dough bread toast with pimento cheese, BBQ chips, water Snack: 2% yogurt, nuts, honey, granola, berries Third Meal: chicken pot pie, brown rice or baked beans with burger on a bun, slices tomato,  water Snack: none reported Beverages: water, coffee with agave   24-Hr Dietary Recall First Meal: oat milk or whole milk, oatmeal with honey, butter, 4 strawberries, coffee with agave or sour dough with jelly and butter or peanut butter  Snack: banana or fruit Second Meal: BBQ sandwich with cole slaw, sweet tea or [employer] vegetable plate with sweet potatoes, green beans, broccoli or whole foods sandwich, water Snack: none Third Meal: protein shake 2/d/w or salmon, salad or GF noodles, grass fed beef, red sauce, salad with poppy seed dressing  Snack: peanut butter with 1/2 slice of sour dough Beverages: EtOH 3 days weekly, zevia selzer, sweet tea once monthly, water  NUTRITION DIAGNOSIS  NB-1.1 Food and nutrition-related knowledge deficit As related to no prior nutrition related education.  As evidenced by Pt report and dietary recall.   NUTRITION INTERVENTION  Nutrition education (E-1) on the following topics:  Fruits & Vegetables: Aim to fill half your plate with a variety of fruits and vegetables. They are rich in vitamins, minerals, and fiber, and can help reduce the risk of chronic diseases. Choose a colorful assortment of fruits and vegetables to ensure you get a wide range of nutrients. Grains and Starches: Make at least half of your grain choices whole grains, such as brown rice, whole wheat bread, and oats. Whole grains provide fiber, which aids in digestion and healthy cholesterol levels. Aim for whole forms of starchy vegetables such as potatoes, sweet potatoes, beans, peas, and corn, which are fiber rich and provide many vitamins and minerals.  Protein: Incorporate lean sources of protein, such as poultry, fish, beans, nuts, and seeds, into your meals. Protein is essential for building and repairing tissues, staying full, balancing blood sugar, as well as supporting immune function. Select proteins low in saturated fat by removing skin from poultry and trimming fat before or after  cooking.  Dairy: Include low-fat or fat-free dairy products like milk, yogurt, and cheese in your diet. Dairy foods are excellent sources of calcium  and vitamin D, which are crucial for bone health.  Physical Activity: Aim for 60 minutes of physical activity daily. Regular physical activity promotes overall health-including helping to reduce risk for heart disease and diabetes, promoting mental health, and helping us  sleep better.    Handouts Provided Reviewed Plate Plate  Heart Healthy Nutrition Label reading Tips  Learning Style & Readiness for Change Teaching method utilized: Visual & Auditory  Demonstrated degree of understanding via: Teach Back  Barriers to learning/adherence to lifestyle change: none  Goals Established by Pt Eat out 1-2 weekly or less-improved, continue Select products with lower saturated fat and sodium-improved, continue   MONITORING & EVALUATION Dietary intake, weekly physical activity, and labs and blood pressure  Next Steps  Patient is to return ~ 7 weeks.

## 2024-06-24 ENCOUNTER — Telehealth: Payer: Self-pay

## 2024-06-24 ENCOUNTER — Other Ambulatory Visit: Payer: Self-pay | Admitting: Family Medicine

## 2024-06-24 DIAGNOSIS — Z131 Encounter for screening for diabetes mellitus: Secondary | ICD-10-CM

## 2024-06-24 DIAGNOSIS — E039 Hypothyroidism, unspecified: Secondary | ICD-10-CM

## 2024-06-24 DIAGNOSIS — E782 Mixed hyperlipidemia: Secondary | ICD-10-CM

## 2024-06-24 DIAGNOSIS — I10 Essential (primary) hypertension: Secondary | ICD-10-CM

## 2024-06-24 NOTE — Telephone Encounter (Signed)
 Copied from CRM [id number]. Topic: General - Other >> Jun 24, 2024  3:08 PM Zebedee SAUNDERS wrote: Reason for CRM: Pt is scheduled for fasting labs on 07/10/2024 but wants to know if she might have orders sent to [facility] [address] [place], [place] [address] [phone] for tomorrow 06/25/2024? Please call pt to confirm at [phone]

## 2024-06-24 NOTE — Telephone Encounter (Signed)
 I have ordered them to be drawn at [facility].  They should be able to see her lab requests when she goes there.

## 2024-06-24 NOTE — Telephone Encounter (Signed)
 Contacted pt to let her know that they have been order and if for some reason they can't see them to call us  and I would fax them over.

## 2024-06-25 ENCOUNTER — Other Ambulatory Visit: Payer: Self-pay | Admitting: *Deleted

## 2024-06-25 DIAGNOSIS — E039 Hypothyroidism, unspecified: Secondary | ICD-10-CM

## 2024-06-25 DIAGNOSIS — Z131 Encounter for screening for diabetes mellitus: Secondary | ICD-10-CM

## 2024-06-26 LAB — HEPATIC FUNCTION PANEL
ALT: 17 IU/L (ref 0–32)
AST: 20 IU/L (ref 0–40)
Albumin: 4.8 g/dL (ref 3.9–4.9)
Alkaline Phosphatase: 73 IU/L (ref 44–121)
Bilirubin Total: 0.6 mg/dL (ref 0.0–1.2)
Bilirubin, Direct: 0.19 mg/dL (ref 0.00–0.40)
Total Protein: 7.6 g/dL (ref 6.0–8.5)

## 2024-06-26 LAB — LIPID PANEL
Chol/HDL Ratio: 3.2 ratio (ref 0.0–4.4)
Cholesterol, Total: 148 mg/dL (ref 100–199)
HDL: 46 mg/dL (ref >39)
LDL Chol Calc (NIH): 77 mg/dL (ref 0–99)
Triglycerides: 143 mg/dL (ref 0–149)
VLDL Cholesterol Cal: 25 mg/dL (ref 5–40)

## 2024-06-26 LAB — CBC WITH DIFFERENTIAL/PLATELET
Basophils Absolute: 0.1 x10E3/uL (ref 0.0–0.2)
Basos: 1 %
EOS (ABSOLUTE): 0.3 x10E3/uL (ref 0.0–0.4)
Eos: 3 %
Hematocrit: 50.4 % — ABNORMAL HIGH (ref 34.0–46.6)
Hemoglobin: 16.8 g/dL — ABNORMAL HIGH (ref 11.1–15.9)
Immature Grans (Abs): 0 x10E3/uL (ref 0.0–0.1)
Immature Granulocytes: 0 %
Lymphocytes Absolute: 1.4 x10E3/uL (ref 0.7–3.1)
Lymphs: 15 %
MCH: 30.4 pg (ref 26.6–33.0)
MCHC: 33.3 g/dL (ref 31.5–35.7)
MCV: 91 fL (ref 79–97)
Monocytes Absolute: 0.6 x10E3/uL (ref 0.1–0.9)
Monocytes: 7 %
Neutrophils Absolute: 6.8 x10E3/uL (ref 1.4–7.0)
Neutrophils: 74 %
Platelets: 233 x10E3/uL (ref 150–450)
RBC: 5.52 x10E6/uL — ABNORMAL HIGH (ref 3.77–5.28)
RDW: 12.7 % (ref 11.7–15.4)
WBC: 9.2 x10E3/uL (ref 3.4–10.8)

## 2024-06-26 LAB — TSH: TSH: 0.028 u[IU]/mL — ABNORMAL LOW (ref 0.450–4.500)

## 2024-06-26 LAB — HEMOGLOBIN A1C
Est. average glucose Bld gHb Est-mCnc: 108 mg/dL
Hgb A1c MFr Bld: 5.4 % (ref 4.8–5.6)

## 2024-06-28 ENCOUNTER — Ambulatory Visit: Payer: Self-pay | Admitting: Family Medicine

## 2024-06-28 ENCOUNTER — Ambulatory Visit: Admitting: Dietician

## 2024-06-28 DIAGNOSIS — E782 Mixed hyperlipidemia: Secondary | ICD-10-CM

## 2024-06-28 DIAGNOSIS — I700 Atherosclerosis of aorta: Secondary | ICD-10-CM

## 2024-06-29 DIAGNOSIS — Z419 Encounter for procedure for purposes other than remedying health state, unspecified: Secondary | ICD-10-CM | POA: Diagnosis not present

## 2024-07-10 ENCOUNTER — Other Ambulatory Visit

## 2024-07-17 ENCOUNTER — Encounter: Admitting: Family Medicine

## 2024-07-25 ENCOUNTER — Ambulatory Visit (INDEPENDENT_AMBULATORY_CARE_PROVIDER_SITE_OTHER): Admitting: Family Medicine

## 2024-07-25 ENCOUNTER — Encounter: Payer: Self-pay | Admitting: Family Medicine

## 2024-07-25 VITALS — BP 148/79 | HR 72 | Ht 66.0 in | Wt 134.4 lb

## 2024-07-25 DIAGNOSIS — I10 Essential (primary) hypertension: Secondary | ICD-10-CM | POA: Diagnosis not present

## 2024-07-25 DIAGNOSIS — Z1211 Encounter for screening for malignant neoplasm of colon: Secondary | ICD-10-CM

## 2024-07-25 DIAGNOSIS — Z1212 Encounter for screening for malignant neoplasm of rectum: Secondary | ICD-10-CM | POA: Diagnosis not present

## 2024-07-25 DIAGNOSIS — E782 Mixed hyperlipidemia: Secondary | ICD-10-CM | POA: Diagnosis not present

## 2024-07-25 DIAGNOSIS — Z0000 Encounter for general adult medical examination without abnormal findings: Secondary | ICD-10-CM | POA: Insufficient documentation

## 2024-07-25 NOTE — Progress Notes (Unsigned)
 Annual physical  Subjective    Patient ID: [name], female    DOB: 04/06/1961  Age: 63 y.o. MRN: [id number]  Chief Complaint  Patient presents with   Annual Exam   HPI [name] is a 63 y.o. old female here  for annual exam.   Subjective - Hyperlipidemia     - Follow up on high calcium  score. Was started on Crestor  20 mg, which lowered LDL to 50. Experienced cognitive side effects (scatterbrained), so self-reduced dose to 10 mg with resolution of symptoms. Repeat LDL was 70-80. Following with cardiology, next appointment is end of year. Has seen a dietician and is following a low saturated fat (<3 g/day) and low sodium (<300 mg/day) diet. - Hypertension     - Continues Losartan . Home BPs are well-controlled around 125/72. No side effects reported. - Hypothyroidism/Hormone replacement     - Follows with Lorenza Junk for hormone management including progesterone, Biest, and testosterone. TSH remains low but specialist is comfortable with levels based on a more extensive thyroid  panel. Feels well. - Health Maintenance     - Discussed colon cancer screening. Is due for colonoscopy but has deferred due to insurance/cost issues with preferred provider. Last colonoscopy was normal. Agreed to proceed with Cologuard testing. 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 Manages this with regular blood donations every 6-8 weeks and has reduced testosterone cream frequency.  Medications Crestor  10 mg daily, Losartan , progesterone capsule at night, Biest cream, testosterone cream (used every 2-3 nights).  PMH, PSH, FH, Social Hx PMHx: Hyperlipidemia, hypertension, hypothyroidism. PSH: Prior normal colonoscopy. FH: Both parents had high cholesterol, heart attacks, and strokes. Noted that they were not overweight. Received a letter that father has a bicuspid aortic valve. Social Hx: Denies tobacco use. Drinks one cocktail on Friday  or Saturday nights.  ROS Constitutional: No fever, chills. Neuro: Reports prior scatterbrained feeling on higher dose of Crestor , which has resolved on lower dose. No other neurological complaints.    The 10-year ASCVD risk score (Arnett DK, et al., 2019) is: 5.9%  Health Maintenance Due  Topic Date Due   HIV Screening  Never done   Hepatitis C Screening  Never done   Pneumococcal Vaccine: 19-49 Years (1 of 2 - PCV) Never done   Cervical Cancer Screening (HPV/Pap Cotest)  Never done   COVID-19 Vaccine (1 - 2024-25 season) Never done   INFLUENZA VACCINE  07/19/2024      Objective:     BP (!) 148/79   Pulse 72   Ht 5' 6 (1.676 m)   Wt 134 lb 6.4 oz (61 kg)   SpO2 98%   BMI 21.69 kg/m  {Vitals History (Optional):23777}  Physical Exam Gen: alert, oriented Pulm: no resp distress Psych: pleasant affect   No results found for any visits on 07/25/24.      Assessment & Plan:   Encounter for colorectal cancer screening -     Cologuard  Moderate mixed hyperlipidemia not requiring statin therapy Assessment & Plan: Reports self-titration of Crestor  from 20mg  to 10mg  due to cognitive side effects, with subsequent improvement in symptoms. LDL remains at goal (70-80). Following with cardiology. Adhering to strict dietary modifications as per dietician recommendations. - Continue Crestor  10 mg daily. - Continue dietary modifications. - Follow up with cardiology as scheduled.   Primary hypertension Assessment & Plan: Well-controlled on current therapy with home readings consistently around 125/72. No side effects  reported from Losartan . - Continue Losartan . - Monitor blood pressure at home, report any significant changes.   Healthcare maintenance Assessment & Plan: Due for colon cancer screening. Prefers to see specific gastroenterologist who is out-of-network. Last colonoscopy was normal. No personal history of polyps. - Order Cologuard test. Counseled that a  positive result will require a colonoscopy. - Order complete transthoracic echocardiogram (TTE) for screening due to new family history of paternal bicuspid aortic valve. - Due for Pneumococcal vaccine. - Follow up in one year for annual physical, or sooner if needed.      Return in about 1 year (around 07/25/2025) for physical.    Toribio MARLA Slain, MD

## 2024-07-25 NOTE — Assessment & Plan Note (Signed)
 Reports self-titration of Crestor  from 20mg  to 10mg  due to cognitive side effects, with subsequent improvement in symptoms. LDL remains at goal (70-80). Following with cardiology. Adhering to strict dietary modifications as per dietician recommendations. - Continue Crestor  10 mg daily. - Continue dietary modifications. - Follow up with cardiology as scheduled.

## 2024-07-25 NOTE — Patient Instructions (Signed)
 It was nice to see you today,  We addressed the following topics today: -I will send in a Cologuard.  Someone will call you before it is mailed to you. - Continue taking your medications. - We will follow-up in 1 year for your next physical.  If you need to see us  sooner you can schedule an appointment.  Have a great day,  Rolan Slain, MD

## 2024-07-25 NOTE — Assessment & Plan Note (Signed)
 Well-controlled on current therapy with home readings consistently around 125/72. No side effects reported from Losartan . - Continue Losartan . - Monitor blood pressure at home, report any significant changes.

## 2024-07-25 NOTE — Assessment & Plan Note (Signed)
 Due for colon cancer screening. Prefers to see specific gastroenterologist who is out-of-network. Last colonoscopy was normal. No personal history of polyps. - Order Cologuard test. Counseled that a positive result will require a colonoscopy. - Order complete transthoracic echocardiogram (TTE) for screening due to new family history of paternal bicuspid aortic valve. - Due for Pneumococcal vaccine. - Follow up in one year for annual physical, or sooner if needed.

## 2024-07-30 DIAGNOSIS — Z419 Encounter for procedure for purposes other than remedying health state, unspecified: Secondary | ICD-10-CM | POA: Diagnosis not present

## 2024-08-09 DIAGNOSIS — Z1212 Encounter for screening for malignant neoplasm of rectum: Secondary | ICD-10-CM | POA: Diagnosis not present

## 2024-08-09 DIAGNOSIS — Z1211 Encounter for screening for malignant neoplasm of colon: Secondary | ICD-10-CM | POA: Diagnosis not present

## 2024-08-15 ENCOUNTER — Ambulatory Visit: Payer: Self-pay | Admitting: Family Medicine

## 2024-08-15 LAB — COLOGUARD: COLOGUARD: NEGATIVE

## 2024-08-30 DIAGNOSIS — Z419 Encounter for procedure for purposes other than remedying health state, unspecified: Secondary | ICD-10-CM | POA: Diagnosis not present

## 2024-09-04 ENCOUNTER — Encounter: Payer: Self-pay | Admitting: Family Medicine

## 2024-09-04 DIAGNOSIS — Z90710 Acquired absence of both cervix and uterus: Secondary | ICD-10-CM | POA: Insufficient documentation

## 2024-09-18 ENCOUNTER — Encounter: Payer: Self-pay | Admitting: Family Medicine

## 2025-01-07 ENCOUNTER — Ambulatory Visit: Payer: Self-pay

## 2025-01-07 NOTE — Telephone Encounter (Signed)
 FYI Only or Action Required?: FYI only for provider: Referred to UC/Seeking appointment for today .  Patient was last seen in primary care on 07/25/2024 by Chandra Toribio POUR, MD.  Called Nurse Triage reporting Dysuria.  Symptoms began a week ago.  Interventions attempted: Rest, hydration, or home remedies.  Symptoms are: unchanged.  Triage Disposition: See Physician Within 24 Hours  Patient/caregiver understands and will follow disposition?: Yes        Message from Antwanette L sent at 01/07/2025  8:41 AM EST  Reason for Triage: Possible uti. Pt reports pain and burning during urination, lower abdominal pain, and fatigue   Reason for Disposition  Age > 50 years  Answer Assessment - Initial Assessment Questions 1. SEVERITY: How bad is the pain?  (e.g., Scale 1-10; mild, moderate, or severe)     Moderate   2. FREQUENCY: How many times have you had painful urination today?      Each urination so far today  3. PATTERN: Is pain present every time you urinate or just sometimes?      Intermittent   4. ONSET: When did the painful urination start?      X 1 week   5. FEVER: Do you have a fever? If Yes, ask: What is your temperature, how was it measured, and when did it start?     No   6. PAST UTI: Have you had a urine infection before? If Yes, ask: When was the last time? and What happened that time?      Yes, has hx. Of kidney stones.   7. CAUSE: What do you think is causing the painful urination?  (e.g., UTI, scratch, Herpes sore)     Possible UTI   8. OTHER SYMPTOMS: Do you have any other symptoms? (e.g., blood in urine, flank pain, genital sores, urgency, vaginal discharge)     Urgency, frequency.     Patient called in to triage with complaints of pain and burning during urination, lower abdominal pain, and fatigue. This has been ongoing for one week.  For home care, the patient is increasing fluid intake.  Referred to UC as the patient is  seeking a same day appointment for later in the day today. Per Epic this RN does not see any available appts. Within that criteria. Patient would like office to call her with any cancellations today. If not, she agrees to be seen in UC.  Protocols used: Urination Pain - Female-A-AH

## 2025-01-28 ENCOUNTER — Ambulatory Visit: Admitting: Family Medicine

## 2025-08-04 ENCOUNTER — Other Ambulatory Visit

## 2025-08-15 ENCOUNTER — Encounter: Admitting: Family Medicine
